# Patient Record
Sex: Female | Born: 1995 | Race: White | Hispanic: No | Marital: Single | Smoking: Never smoker
Health system: Southern US, Community
[De-identification: ages and names within clinical notes are randomized; demographics above are authoritative.]

## PROBLEM LIST (undated history)

## (undated) DIAGNOSIS — S060X9A Concussion with loss of consciousness of unspecified duration, initial encounter: Secondary | ICD-10-CM

## (undated) DIAGNOSIS — J45909 Unspecified asthma, uncomplicated: Secondary | ICD-10-CM

## (undated) HISTORY — PX: OTHER SURGICAL HISTORY: SHX169

## (undated) HISTORY — PX: FINGER SURGERY: SHX640

---
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
## 2017-06-09 ENCOUNTER — Encounter (HOSPITAL_COMMUNITY): Payer: Self-pay | Admitting: Emergency Medicine

## 2017-06-09 ENCOUNTER — Emergency Department (HOSPITAL_COMMUNITY)
Admission: EM | Admit: 2017-06-09 | Discharge: 2017-06-10 | Disposition: A | Discharge status: Home / Self Care | Payer: Medicaid Other | Attending: Emergency Medicine | Admitting: Emergency Medicine

## 2017-06-09 ENCOUNTER — Emergency Department (HOSPITAL_COMMUNITY): Payer: Medicaid Other

## 2017-06-09 DIAGNOSIS — J45909 Unspecified asthma, uncomplicated: Secondary | ICD-10-CM | POA: Diagnosis not present

## 2017-06-09 DIAGNOSIS — Z885 Allergy status to narcotic agent status: Secondary | ICD-10-CM | POA: Diagnosis not present

## 2017-06-09 DIAGNOSIS — M25572 Pain in left ankle and joints of left foot: Secondary | ICD-10-CM | POA: Insufficient documentation

## 2017-06-09 DIAGNOSIS — Z79899 Other long term (current) drug therapy: Secondary | ICD-10-CM | POA: Insufficient documentation

## 2017-06-09 DIAGNOSIS — Z23 Encounter for immunization: Secondary | ICD-10-CM | POA: Diagnosis not present

## 2017-06-09 MED ORDER — TETANUS-DIPHTH-ACELL PERTUSSIS 5-2.5-18.5 LF-MCG/0.5 IM SUSP
0.5000 mL | Freq: Once | INTRAMUSCULAR | Status: AC
  Administered 2017-06-10: 0.5 mL via INTRAMUSCULAR
  Filled 2017-06-09: qty 0.5

## 2017-06-09 NOTE — ED Provider Notes (Signed)
[facility]EMER[facility]ENCY DEPT Provider Note   CSN: [id number] Arrival date & time: 06/09/17  2045  By signing my name below, I, [name], attest that this documentation has been prepared under the direction and in the presence of SPX CorporationMichael Maczis, PA-C.  Electronically Signed: [name], ED Scribe. 06/09/17. 11:44 PM.  History   Chief Complaint Chief Complaint  Patient presents with  . Ankle Pain   The history is provided by the patient. No language interpreter was used.   HPI Comments: Cathy Tran is a 21 y.o. female who presents to the Emergency Department complaining of sudden onset, persistent left ankle pain beginning prior to arrival approximately 3 hours ago. Pt reports that she was standing near a wall that was holding a large fire extinguisher when she back into it and it fell of onto her left ankle. She has been able to bear weight on the ankle since the incident, however, she notes this will exacerbate her pain. No treatments for her pain were tried prior to coming into the ED. She denies numbness, weakness, paraesthesias, or any other associated symptoms. Tetanus is not UTD.   Past Medical History:  Diagnosis Date  . Asthma   . Concussion July 2014   Patient Active Problem List   Diagnosis Date Noted  . Nexplanon in place 11/12/2015  . Dysmenorrhea in adolescent 09/16/2015  . Anxiety state, unspecified 11/20/2013  . Postconcussion syndrome 09/20/2013  . Memory difficulties 09/20/2013  . Poor concentration 09/20/2013   Past Surgical History:  Procedure Laterality Date  . arm surgery    . FINGER SURGERY Left    Cyst removed from left ring finger   OB History    Gravida Para Term Preterm AB Living   0 0 0 0 0 0   SAB TAB Ectopic Multiple Live Births   0 0 0 0       Home Medications    Prior to Admission medications   Medication Sig Start Date End Date Taking? Authorizing Provider  Acetaminophen-Pamabrom (MIDOL TEEN) 500-25 MG TABS Take 2 tablets  by mouth every 6 (six) hours as needed. Patient not taking: Reported on 11/12/2016 09/16/15   Orvilla Cornwallenney, Rachelle A, CNM  albuterol (PROVENTIL HFA;VENTOLIN HFA) 108 (90 BASE) MCG/ACT inhaler Inhale 2 puffs into the lungs every 6 (six) hours as needed for wheezing or shortness of breath.    [provider]  b complex vitamins tablet Take 1 tablet by mouth daily.    [provider]  doxycycline (MONODOX) 100 MG capsule Take 1 capsule (100 mg total) by mouth 2 (two) times daily. Patient not taking: Reported on 11/12/2016 11/12/15   Orvilla Cornwallenney, Rachelle A, CNM  ibuprofen (ADVIL,MOTRIN) 600 MG tablet Take 1 tablet (600 mg total) by mouth 3 (three) times daily. While on your  period Patient not taking: Reported on 11/12/2016 09/16/15   Roe Coombsenney, Rachelle A, CNM   Family History Family History  Problem Relation Age of Onset  . Adopted: Yes  . Drug abuse Mother   . ADD / ADHD Father   . Drug abuse Father   . Depression Father   . Heart attack Paternal Grandfather    Social History Social History  Substance Use Topics  . Smoking status: Never Smoker  . Smokeless tobacco: Never Used  . Alcohol use No   Allergies   Codeine and Other  Review of Systems Review of Systems  Musculoskeletal: Positive for arthralgias and myalgias.  Skin: Positive for wound.  Neurological: Negative for  weakness and numbness.   Physical Exam Updated Vital Signs BP 121/68 (BP Location: Left Arm)   Pulse (!) 16   Temp 98.3 F (36.8 C) (Oral)   Resp 16   Ht 5\' 1"  (1.549 m)   Wt 52.2 kg (115 lb)   SpO2 100%   BMI 21.73 kg/m   Physical Exam  Constitutional: She appears well-developed and well-nourished. No distress.  HENT:  Head: Normocephalic and atraumatic.  Right Ear: External ear normal.  Left Ear: External ear normal.  Eyes: Conjunctivae are normal. Right eye exhibits no discharge. Left eye exhibits no discharge. No scleral icterus.  Neck: Normal range of motion.  Cardiovascular: Normal rate.     Pulses:      Dorsalis pedis pulses are 2+ on the right side, and 2+ on the left side.       Posterior tibial pulses are 2+ on the right side, and 2+ on the left side.  Pulmonary/Chest: Effort normal. No respiratory distress.  Abdominal: She exhibits no distension.  Musculoskeletal: Normal range of motion.       Left ankle: She exhibits normal range of motion (painful with plantar flexion ), no swelling and no deformity. Tenderness ( over area of abrasion). Achilles tendon normal.       Left foot: Normal.  NVI distally.   Neurological: She is alert. No sensory deficit. Gait ( painful but able) normal.  Skin: Skin is warm and dry. Capillary refill takes less than 2 seconds. No pallor.  1cm superficial abrasion over the achilles tendon. No exposed muscle or tendons.   Psychiatric: She has a normal mood and affect. Her behavior is normal.  Nursing note and vitals reviewed.  ED Treatments / Results  DIAGNOSTIC STUDIES: Oxygen Saturation is 99% on RA, normal by my interpretation.   COORDINATION OF CARE: 11:44 PM-Discussed next steps with pt. Pt verbalized understanding and is agreeable with the plan.   Labs (all labs ordered are listed, but only abnormal results are displayed) Labs Reviewed - No data to display  EKG  EKG Interpretation None      Radiology Dg Ankle Complete Left  Result Date: 06/09/2017 CLINICAL DATA:  Injury to the left ankle from a fall. Pain over the heel with a small cut. Pain with flexion and extension. EXAM: LEFT ANKLE COMPLETE - 3+ VIEW COMPARISON:  Left foot 11/05/2011 FINDINGS: There is no evidence of fracture, dislocation, or joint effusion. There is no evidence of arthropathy or other focal bone abnormality. Soft tissues are unremarkable. IMPRESSION: Negative. Electronically Signed   By: Burman Nieves M.D.   On: 06/09/2017 21:24   Procedures Procedures   Medications Ordered in ED Medications  Tdap (BOOSTRIX) injection 0.5 mL (0.5 mLs Intramuscular  Given 06/10/17 0017)    Initial Impression / Assessment and Plan / ED Course  I have reviewed the triage vital signs and the nursing notes.  Pertinent labs & imaging results that were available during my care of the patient were reviewed by me and considered in my medical decision making (see chart for details).     This is an 21yo female who presents into the ED following a large fire extinguisher falling onto the left ankle. Patient XR negative for obvious fracture, dislocation, or other bony abnormalities. Pain medication offered in ED but patient denies at this time. Pt advised to follow up with orthopedics if symptoms persist for possibility of missed fracture diagnosis. Conservative therapy recommended and discussed. Patient will be d/c home. Pt is comfortable  with above plan and is stable for discharge at this time. All questions were answered prior to disposition. Strict return precautions for f/u into the ED were discussed.   Final Clinical Impressions(s) / ED Diagnoses   Final diagnoses:  Acute left ankle pain   New Prescriptions Discharge Medication List as of 06/10/2017 12:00 AM     I personally performed the services described in this documentation, which was scribed in my presence. The recorded information has been reviewed and is accurate.       Jacinto HalimMaczis, Michael M, PA-C 06/10/17 0131    Jacinto HalimMaczis, Michael M, PA-C 06/10/17 0131    Jacalyn LefevreHaviland, Julie, MD 06/13/17 [id number]

## 2017-06-09 NOTE — ED Triage Notes (Signed)
Pt c/o left ankle pain after a fire extinguisher fell on her. Pedal pulses present.

## 2017-06-10 NOTE — Discharge Instructions (Signed)
Your xrays were negative for fracture today. You were given a tetanus shot. Please follow rice therapy for the following days. Please follow up with your PCP for persistent symptoms. If you develop worsening or new concerning symptoms you can return to the emergency department for re-evaluation.

## 2017-07-22 ENCOUNTER — Emergency Department (HOSPITAL_COMMUNITY)
Admission: EM | Admit: 2017-07-22 | Discharge: 2017-07-22 | Disposition: A | Discharge status: Home / Self Care | Payer: Medicaid Other | Attending: Emergency Medicine | Admitting: Emergency Medicine

## 2017-07-22 ENCOUNTER — Encounter (HOSPITAL_COMMUNITY): Payer: Self-pay

## 2017-07-22 DIAGNOSIS — M542 Cervicalgia: Secondary | ICD-10-CM | POA: Insufficient documentation

## 2017-07-22 DIAGNOSIS — R51 Headache: Secondary | ICD-10-CM | POA: Diagnosis not present

## 2017-07-22 DIAGNOSIS — Z5321 Procedure and treatment not carried out due to patient leaving prior to being seen by health care provider: Secondary | ICD-10-CM | POA: Diagnosis not present

## 2017-07-22 NOTE — ED Triage Notes (Signed)
Pt presents to the ed with complaints of left sided neck pain that goes down into her left arm that has been going on for 2 years. Over the last six weeks she has started developing migraines that are throbbing in nature daily. She went to her doctor who states that she seems off balance to her so she told her to come here for a head ct. Pt has no neuro deficits in triage. Alert oriented and ambulatory. Complains of only mild neck and shoulder pain at this time.

## 2017-07-23 ENCOUNTER — Encounter (HOSPITAL_COMMUNITY): Payer: Self-pay | Admitting: Emergency Medicine

## 2017-07-23 ENCOUNTER — Emergency Department (HOSPITAL_COMMUNITY): Payer: Medicaid Other

## 2017-07-23 ENCOUNTER — Emergency Department (HOSPITAL_COMMUNITY)
Admission: EM | Admit: 2017-07-23 | Discharge: 2017-07-23 | Disposition: A | Discharge status: Home / Self Care | Payer: Medicaid Other | Attending: Physician Assistant | Admitting: Physician Assistant

## 2017-07-23 DIAGNOSIS — M542 Cervicalgia: Secondary | ICD-10-CM | POA: Insufficient documentation

## 2017-07-23 DIAGNOSIS — R51 Headache: Secondary | ICD-10-CM | POA: Diagnosis not present

## 2017-07-23 DIAGNOSIS — J45909 Unspecified asthma, uncomplicated: Secondary | ICD-10-CM | POA: Insufficient documentation

## 2017-07-23 LAB — URINALYSIS, ROUTINE W REFLEX MICROSCOPIC
Bacteria, UA: NONE SEEN
Bilirubin Urine: NEGATIVE
Glucose, UA: NEGATIVE mg/dL
KETONES UR: NEGATIVE mg/dL
LEUKOCYTES UA: NEGATIVE
Nitrite: NEGATIVE
PH: 7 (ref 5.0–8.0)
Protein, ur: NEGATIVE mg/dL
RBC / HPF: NONE SEEN RBC/hpf (ref 0–5)
Specific Gravity, Urine: 1.008 (ref 1.005–1.030)
Squamous Epithelial / LPF: NONE SEEN

## 2017-07-23 LAB — CBC WITH DIFFERENTIAL/PLATELET
BASOS ABS: 0 10*3/uL (ref 0.0–0.1)
Basophils Relative: 0 %
Eosinophils Absolute: 0.3 10*3/uL (ref 0.0–0.7)
Eosinophils Relative: 5 %
HCT: 37.5 % (ref 36.0–46.0)
Hemoglobin: 13 g/dL (ref 12.0–15.0)
LYMPHS PCT: 45 %
Lymphs Abs: 3.2 10*3/uL (ref 0.7–4.0)
MCH: 30.9 pg (ref 26.0–34.0)
MCHC: 34.7 g/dL (ref 30.0–36.0)
MCV: 89.1 fL (ref 78.0–100.0)
MONO ABS: 0.5 10*3/uL (ref 0.1–1.0)
Monocytes Relative: 7 %
Neutro Abs: 3 10*3/uL (ref 1.7–7.7)
Neutrophils Relative %: 43 %
Platelets: 292 10*3/uL (ref 150–400)
RBC: 4.21 MIL/uL (ref 3.87–5.11)
RDW: 12.2 % (ref 11.5–15.5)
WBC: 7 10*3/uL (ref 4.0–10.5)

## 2017-07-23 LAB — COMPREHENSIVE METABOLIC PANEL
ALBUMIN: 4.6 g/dL (ref 3.5–5.0)
ALT: 14 U/L (ref 14–54)
AST: 20 U/L (ref 15–41)
Alkaline Phosphatase: 81 U/L (ref 38–126)
Anion gap: 10 (ref 5–15)
BILIRUBIN TOTAL: 0.5 mg/dL (ref 0.3–1.2)
BUN: 14 mg/dL (ref 6–20)
CO2: 23 mmol/L (ref 22–32)
Calcium: 9 mg/dL (ref 8.9–10.3)
Chloride: 105 mmol/L (ref 101–111)
Creatinine, Ser: 0.68 mg/dL (ref 0.44–1.00)
GFR calc Af Amer: >60 mL/min (ref >60)
GFR calc non Af Amer: >60 mL/min (ref >60)
GLUCOSE: 101 mg/dL — AB (ref 65–99)
Potassium: 3.5 mmol/L (ref 3.5–5.1)
Sodium: 138 mmol/L (ref 135–145)
TOTAL PROTEIN: 7.3 g/dL (ref 6.5–8.1)

## 2017-07-23 LAB — I-STAT BETA HCG BLOOD, ED (MC, WL, AP ONLY): I-stat hCG, quantitative: <5 m[IU]/mL (ref <5)

## 2017-07-23 MED ORDER — METHOCARBAMOL 500 MG PO TABS: 500.0000 mg | ORAL_TABLET | Freq: Two times a day (BID) | ORAL | 0 refills | Status: AC

## 2017-07-23 MED ORDER — SODIUM CHLORIDE 0.9 % IV BOLUS (SEPSIS)
1000.0000 mL | Freq: Once | INTRAVENOUS | Status: AC
  Administered 2017-07-23: 1000 mL via INTRAVENOUS

## 2017-07-23 MED ORDER — LIDOCAINE 5 % EX PTCH: 1.0000 | MEDICATED_PATCH | CUTANEOUS | 0 refills | Status: AC

## 2017-07-23 NOTE — ED Provider Notes (Signed)
[facility] Provider Note   CSN: [id number] Arrival date & time: 07/23/17  4098     History   Chief Complaint Chief Complaint  Patient presents with  . Neck Pain    HPI Cathy Tran is a 21 y.o. female.  HPI   Cathy Tran is a 21 y.o. female, with a history of asthma and concussion, presenting to the ED with left neck pain over the last 2 years, worse and more frequent over the last 2 weeks. She now endorses 2 episodes per week. Pain starts in the left side of the neck and radiates into the left shoulder and down the left arm. She states that sometimes the pain radiates up into her head and causes a "migraine." Currently she has no headache. Her neck pain is rated 1/10, described as a sharp, radiating into the left shoulder. Declines pain management during initial interview.  Denies falls/trauma, weakness, numbness, vision changes, difficulty breathing, difficulty swallowing, facial droop, dizziness, fever/chills, or any other complaints.   Patient is accompanied by her mother at the bedside. States patient was seen yesterday at [facility] pediatrics for evaluation of her pain. Reportedly, she had neuro deficits that included tongue deviation to the left and loss of balance. Patient was sent to the ED POV and told that she needed emergent imaging. Mother states that they waited for a few hours and then left.    Past Medical History:  Diagnosis Date  . Asthma   . Concussion July 2014    Patient Active Problem List   Diagnosis Date Noted  . Nexplanon in place 11/12/2015  . Dysmenorrhea in adolescent 09/16/2015  . Anxiety state, unspecified 11/20/2013  . Postconcussion syndrome 09/20/2013  . Memory difficulties 09/20/2013  . Poor concentration 09/20/2013    Past Surgical History:  Procedure Laterality Date  . arm surgery    . FINGER SURGERY Left    Cyst removed from left ring finger    OB History    Gravida Para Term Preterm AB Living   0 0 0 0 0 0   SAB  TAB Ectopic Multiple Live Births   0 0 0 0         Home Medications    Prior to Admission medications   Medication Sig Start Date End Date Taking? Authorizing Provider  Multiple Vitamins-Minerals (MULTIVITAMIN PO) Take 1 tablet by mouth daily.   Yes [provider]  Acetaminophen-Pamabrom (MIDOL TEEN) 500-25 MG TABS Take 2 tablets by mouth every 6 (six) hours as needed. Patient not taking: Reported on 11/12/2016 09/16/15   Orvilla Cornwall A, CNM  albuterol (PROVENTIL HFA;VENTOLIN HFA) 108 (90 BASE) MCG/ACT inhaler Inhale 2 puffs into the lungs every 6 (six) hours as needed for wheezing or shortness of breath.    [provider]  ibuprofen (ADVIL,MOTRIN) 600 MG tablet Take 1 tablet (600 mg total) by mouth 3 (three) times daily. While on your  period Patient not taking: Reported on 11/12/2016 09/16/15   Orvilla Cornwall A, CNM  lidocaine (LIDODERM) 5 % Place 1 patch onto the skin daily. Remove & Discard patch within 12 hours or as directed by MD 07/23/17   Russell Engelstad C, PA-C  methocarbamol (ROBAXIN) 500 MG tablet Take 1 tablet (500 mg total) by mouth 2 (two) times daily. 07/23/17   Grabiel Schmutz, Hillard Danker, PA-C    Family History Family History  Problem Relation Age of Onset  . Adopted: Yes  . Drug abuse Mother   . ADD / ADHD Father   .  Drug abuse Father   . Depression Father   . Heart attack Paternal Grandfather     Social History Social History  Substance Use Topics  . Smoking status: Never Smoker  . Smokeless tobacco: Never Used  . Alcohol use No     Allergies   Codeine and Other   Review of Systems Review of Systems  Constitutional: Negative for chills, diaphoresis and fever.  Respiratory: Negative for shortness of breath.   Cardiovascular: Negative for chest pain.  Gastrointestinal: Negative for abdominal pain, nausea and vomiting.  Genitourinary: Negative for difficulty urinating and dysuria.  Musculoskeletal: Positive for neck pain. Negative for back pain.    Skin: Negative for rash and wound.  Neurological: Positive for headaches. Negative for dizziness, seizures, syncope, weakness, light-headedness and numbness.  All other systems reviewed and are negative.    Physical Exam Updated Vital Signs BP 115/82 (BP Location: Left Arm)   Pulse 92   Temp 98.6 F (37 C) (Oral)   Resp 16   SpO2 100%   Physical Exam  Constitutional: She is oriented to person, place, and time. She appears well-developed and well-nourished. No distress.  HENT:  Head: Normocephalic and atraumatic.  Eyes: Pupils are equal, round, and reactive to light. Conjunctivae and EOM are normal.  Neck: Normal range of motion. Neck supple.  Cardiovascular: Normal rate, regular rhythm, normal heart sounds and intact distal pulses.   Pulmonary/Chest: Effort normal and breath sounds normal. No respiratory distress.  Abdominal: There is no guarding.  Musculoskeletal: She exhibits no edema.  No tenderness to the neck or shoulders bilaterally. Full range of motion without pain or other symptoms elicited in the bilateral shoulders, elbows, wrists, and hands. No noted swelling, crepitus, erythema, or effusions. Normal motor function intact in all extremities and spine. No midline spinal tenderness.   Lymphadenopathy:    She has no cervical adenopathy.  Neurological: She is alert and oriented to person, place, and time.  No sensory deficits.  No noted speech deficits. No aphasia. Patient handles oral secretions without difficulty. No noted swallowing defects.  Equal grip strength bilaterally. Strength 5/5 in the upper extremities. Strength 5/5 with flexion and extension of the hips, knees, and ankles bilaterally.  Patellar DTRs 2+ bilaterally. Negative Romberg. No gait disturbance.  Coordination intact including heel to shin and finger to nose.  Cranial nerves III-XII grossly intact.  No facial droop.   Skin: Skin is warm and dry. Capillary refill takes less than 2 seconds. She is  not diaphoretic.  Psychiatric: She has a normal mood and affect. Her behavior is normal.  Nursing note and vitals reviewed.    ED Treatments / Results  Labs (all labs ordered are listed, but only abnormal results are displayed) Labs Reviewed  COMPREHENSIVE METABOLIC PANEL - Abnormal; Notable for the following:       Result Value   Glucose, Bld 101 (*)    All other components within normal limits  URINALYSIS, ROUTINE W REFLEX MICROSCOPIC - Abnormal; Notable for the following:    Color, Urine STRAW (*)    Hgb urine dipstick SMALL (*)    All other components within normal limits  CBC WITH DIFFERENTIAL/PLATELET  I-STAT BETA HCG BLOOD, ED ([facility], [facility], [facility] ONLY)    EKG  EKG Interpretation None       Radiology Ct Head Wo Contrast  Result Date: 07/23/2017 CLINICAL DATA:  Left neck pain radiating into left arm for 6 weeks. EXAM: CT HEAD WITHOUT CONTRAST TECHNIQUE: Contiguous axial images were  obtained from the base of the skull through the vertex without intravenous contrast. COMPARISON:  October 31, 2013 FINDINGS: Brain: No evidence of acute infarction, hemorrhage, hydrocephalus, extra-axial collection or mass lesion/mass effect. Vascular: No hyperdense vessel or unexpected calcification. Skull: Normal. Negative for fracture or focal lesion. Sinuses/Orbits: The paranasal sinuses, middle ears, and right mastoid air cells are within normal limits. Opacification of inferior medial left mastoid air cells is unchanged since 2014, of no acute significance. Other: None. IMPRESSION: No acute abnormality.  Essentially normal study. Electronically Signed   By: Gerome Sam III M.D   On: 07/23/2017 13:45   Mr Cervical Spine Wo Contrast  Result Date: 07/23/2017 CLINICAL DATA:  Intermittent shooting pain in the left neck that radiates into the left arm. EXAM: MRI CERVICAL AND THORACIC SPINE WITHOUT CONTRAST TECHNIQUE: Multiplanar and multiecho pulse sequences of the cervical spine, to include the  craniocervical junction and cervicothoracic junction, and thoracic spine, were obtained without intravenous contrast. COMPARISON:  None. FINDINGS: MRI CERVICAL SPINE FINDINGS Alignment: Reversal of the normal cervical lordosis, centered at C5. Sagittal alignment is maintained. Vertebrae: No fracture, evidence of discitis, or bone lesion. Cord: Normal signal and morphology. Posterior Fossa, vertebral arteries, paraspinal tissues: Negative. Disc levels: C2-C3: Mild left uncovertebral hypertrophy resulting in mild left neuroforaminal stenosis. No significant central spinal canal or right neuroforaminal stenosis. C3-C4: Minimal bilateral uncovertebral hypertrophy resulting in minimal bilateral neuroforaminal stenosis. No significant central spinal canal stenosis. C4-C5:  Negative. C5-C6: Trace disc bulge without spinal canal or neuroforaminal stenosis. C6-C7:  Negative. C7-T1:  Negative. MRI THORACIC SPINE FINDINGS Alignment:  Physiologic. Vertebrae: No fracture, evidence of discitis, or bone lesion. Cord:  Normal signal and morphology. Paraspinal and other soft tissues: Negative. Disc levels: Normal.  No spinal canal or neuroforaminal stenosis at any level. IMPRESSION: 1. Minimal degenerative changes of the cervical spine, as described above. Mild left neuroforaminal stenosis at C2-C3. 2. Normal MRI of the thoracic spine. Electronically Signed   By: Obie Dredge M.D.   On: 07/23/2017 11:45   Mr Thoracic Spine Wo Contrast  Result Date: 07/23/2017 CLINICAL DATA:  Intermittent shooting pain in the left neck that radiates into the left arm. EXAM: MRI CERVICAL AND THORACIC SPINE WITHOUT CONTRAST TECHNIQUE: Multiplanar and multiecho pulse sequences of the cervical spine, to include the craniocervical junction and cervicothoracic junction, and thoracic spine, were obtained without intravenous contrast. COMPARISON:  None. FINDINGS: MRI CERVICAL SPINE FINDINGS Alignment: Reversal of the normal cervical lordosis, centered  at C5. Sagittal alignment is maintained. Vertebrae: No fracture, evidence of discitis, or bone lesion. Cord: Normal signal and morphology. Posterior Fossa, vertebral arteries, paraspinal tissues: Negative. Disc levels: C2-C3: Mild left uncovertebral hypertrophy resulting in mild left neuroforaminal stenosis. No significant central spinal canal or right neuroforaminal stenosis. C3-C4: Minimal bilateral uncovertebral hypertrophy resulting in minimal bilateral neuroforaminal stenosis. No significant central spinal canal stenosis. C4-C5:  Negative. C5-C6: Trace disc bulge without spinal canal or neuroforaminal stenosis. C6-C7:  Negative. C7-T1:  Negative. MRI THORACIC SPINE FINDINGS Alignment:  Physiologic. Vertebrae: No fracture, evidence of discitis, or bone lesion. Cord:  Normal signal and morphology. Paraspinal and other soft tissues: Negative. Disc levels: Normal.  No spinal canal or neuroforaminal stenosis at any level. IMPRESSION: 1. Minimal degenerative changes of the cervical spine, as described above. Mild left neuroforaminal stenosis at C2-C3. 2. Normal MRI of the thoracic spine. Electronically Signed   By: Obie Dredge M.D.   On: 07/23/2017 11:45    Procedures Procedures (including critical care time)  Medications Ordered in ED Medications  sodium chloride 0.9 % bolus 1,000 mL (0 mLs Intravenous Stopped 07/23/17 1426)     Initial Impression / Assessment and Plan / ED Course  I have reviewed the triage vital signs and the nursing notes.  Pertinent labs & imaging results that were available during my care of the patient were reviewed by me and considered in my medical decision making (see chart for details).  Clinical Course as of Jul 24 1651  Sat Jul 23, 2017  1610 Spoke with [facility] answering service.  [SJ]  [id number] Spoke with Dr. Eartha Inch, on call pediatrician for [facility] pediatrics. Patient was seen yesterday by Sheran Spine, FNP and overseen by Dr. Eartha Inch, who did not  personally assess the patient. States the NP noted. Tongue deviation to the left and loss of balance with left-sided lean. States they wanted the patient assessed yesterday with possible stroke on the differential. She denies any other focal deficits noted on exam. Dr. Eartha Inch was informed that the patient has no such deficits today. She states that she is more than willing to follow up with the patient on any radiculopathy work up.  [SJ]  1228 Discussed MRI and lab results with the patient. Patient is pain-free.  [SJ]    Clinical Course User Index [SJ] Shalyn Koral C, PA-C    Patient presents with complaints of neck pain and requests for MRI. Patient has no neuro deficits on her exam today. Upon repeat assessment she had no changes in her neuro exam. There were no acute abnormalities on patient's imaging studies or lab tests. My suspicion for stroke or TIA in this patient is very low. PCP versus orthopedic versus neurosurgery follow-up for neck pain. PCP versus neurology follow-up for headaches. The patient was given instructions for home care as well as return precautions. Patient voices understanding of these instructions, accepts the plan, and is comfortable with discharge.  Findings and plan of care discussed with Bary Castilla, MD. Dr. Corlis Leak personally evaluated and examined this patient.  Vitals:   07/23/17 0915 07/23/17 1145 07/23/17 1200 07/23/17 1432  BP: 116/64 107/63 108/66 110/69  Pulse: 79 84 76 79  Resp:    14  Temp:    98.7 F (37.1 C)  TempSrc:    Oral  SpO2: 100% 99% 100% 100%     Final Clinical Impressions(s) / ED Diagnoses   Final diagnoses:  Neck pain    New Prescriptions Discharge Medication List as of 07/23/2017  1:53 PM    START taking these medications   Details  lidocaine (LIDODERM) 5 % Place 1 patch onto the skin daily. Remove & Discard patch within 12 hours or as directed by MD, Starting Sat 07/23/2017, Print    methocarbamol (ROBAXIN) 500 MG tablet  Take 1 tablet (500 mg total) by mouth 2 (two) times daily., Starting Sat 07/23/2017, Print         Anselm Pancoast, PA-C 07/23/17 1652    Abelino Derrick, MD 07/24/17 1406

## 2017-07-23 NOTE — Discharge Instructions (Signed)
There were no acute abnormalities noted on the lab work.   Antiinflammatory medications: Take 600 mg of ibuprofen every 6 hours or 440 mg (over the counter dose) to 500 mg (prescription dose) of naproxen every 12 hours for the next 3 days. After this time, these medications may be used as needed for pain. Take these medications with food to avoid upset stomach. Choose only one of these medications, do not take them together.  Tylenol: Should you continue to have additional pain while taking the ibuprofen or naproxen, you may add in tylenol as needed. Your daily total maximum amount of tylenol from all sources should be limited to /day for persons without liver problems, or /day for those with liver problems. Muscle relaxer: Robaxin is a muscle relaxer and may help loosen stiff muscles. Do not take the Robaxin while driving or performing other dangerous activities.  Lidocaine patches: These are available via either prescription or over-the-counter. The over-the-counter option may be more economical one and are likely just as effective. There are multiple over-the-counter brands, such as Salonpas. Exercises: Be sure to perform the attached exercises starting with three times a week and working up to performing them daily. This is an essential part of preventing long term problems. Note that the sheet regarding cervical strain/sprain does not denote your diagnosis, it has been included simply for the exercises.   Follow up with a primary care provider for any future management of these complaints. May also follow-up with orthopedics or neurosurgery. Follow-up with your primary care provider or with neurology for further management of the headaches.

## 2017-07-23 NOTE — ED Triage Notes (Signed)
Patient complains of intermittent shooting pain in her left neck that radiates into the left arm for the last few years, exacerbated by movement of the arm, pain is minimal at rest.  Denies chest pain, shortness of breath, nausea.  Also reports the pain is often accompanied by migraines.  Patient alert and oriented and in no apparent distress at this time.

## 2017-08-01 ENCOUNTER — Encounter (INDEPENDENT_AMBULATORY_CARE_PROVIDER_SITE_OTHER): Payer: Self-pay | Admitting: Orthopaedic Surgery

## 2017-08-01 ENCOUNTER — Ambulatory Visit (INDEPENDENT_AMBULATORY_CARE_PROVIDER_SITE_OTHER): Payer: Medicaid Other | Admitting: Orthopaedic Surgery

## 2017-08-01 DIAGNOSIS — M25512 Pain in left shoulder: Secondary | ICD-10-CM | POA: Diagnosis not present

## 2017-08-01 DIAGNOSIS — G8929 Other chronic pain: Secondary | ICD-10-CM | POA: Diagnosis not present

## 2017-08-01 NOTE — Addendum Note (Signed)
Addended by: Albertina Parr on: 08/01/2017 03:56 PM   Modules accepted: Orders

## 2017-08-01 NOTE — Progress Notes (Signed)
Office Visit Note   Patient: Cathy Tran           Date of Birth: 1996/05/17           MRN: [id number] Visit Date: 08/01/2017              Requested by: Chales Salmon, MD [address] [place], [place] [address] PCP: Chales Salmon, MD   Assessment & Plan: Visit Diagnoses:  1. Chronic left shoulder pain     Plan: MRI shows some mild foraminal stenosis at C2-3. Overall not convinced that this is the source of her problems. I recommend referral to Dr. Alvester Morin to see if an injection is worth trying. If this does not alleviate her symptoms then I would recommend seeing her PCP again. I do not detect any evidence of carpal tunnel syndrome on exam today either.  Follow-Up Instructions: Return if symptoms worsen or fail to improve.   Orders:  No orders of the defined types were placed in this encounter.  No orders of the defined types were placed in this encounter.     Procedures: No procedures performed   Clinical Data: No additional findings.   Subjective: Chief Complaint  Patient presents with  . Neck - Pain    Patient is a 21 year old female comes in with radiation of pain from her neck into her left hand. This is occasional and sporadic. She denies any injuries. Denies any numbness and tingling in her left hand which consistently. She does not report dropping things.    Review of Systems  Constitutional: Negative.   HENT: Negative.   Eyes: Negative.   Respiratory: Negative.   Cardiovascular: Negative.   Endocrine: Negative.   Musculoskeletal: Negative.   Neurological: Negative.   Hematological: Negative.   Psychiatric/Behavioral: Negative.   All other systems reviewed and are negative.    Objective: Vital Signs: There were no vitals taken for this visit.  Physical Exam  Constitutional: She is oriented to person, place, and time. She appears well-developed and well-nourished.  HENT:  Head: Normocephalic and atraumatic.  Eyes: EOM are normal.  Neck:  Neck supple.  Pulmonary/Chest: Effort normal.  Abdominal: Soft.  Neurological: She is alert and oriented to person, place, and time.  Skin: Skin is warm. Capillary refill takes less than 2 seconds.  Psychiatric: She has a normal mood and affect. Her behavior is normal. Judgment and thought content normal.  Nursing note and vitals reviewed.   Ortho Exam Physical exam shows a equivocal Spurling's test. Shoulder exam is normal. No focal motor or sensory deficits. Specialty Comments:  No specialty comments available.  Imaging: No results found.   PMFS History: Patient Active Problem List   Diagnosis Date Noted  . Chronic left shoulder pain 08/01/2017  . Nexplanon in place 11/12/2015  . Dysmenorrhea in adolescent 09/16/2015  . Anxiety state, unspecified 11/20/2013  . Postconcussion syndrome 09/20/2013  . Memory difficulties 09/20/2013  . Poor concentration 09/20/2013   Past Medical History:  Diagnosis Date  . Asthma   . Concussion July 2014    Family History  Problem Relation Age of Onset  . Adopted: Yes  . Drug abuse Mother   . ADD / ADHD Father   . Drug abuse Father   . Depression Father   . Heart attack Paternal Grandfather     Past Surgical History:  Procedure Laterality Date  . arm surgery    . FINGER SURGERY Left    Cyst removed from left ring finger  Social History   Occupational History  . Not on file.   Social History Main Topics  . Smoking status: Never Smoker  . Smokeless tobacco: Never Used  . Alcohol use No  . Drug use: No  . Sexual activity: Yes    Birth control/ protection: Implant, Condom

## 2017-08-23 ENCOUNTER — Ambulatory Visit (INDEPENDENT_AMBULATORY_CARE_PROVIDER_SITE_OTHER): Payer: Medicaid Other | Admitting: Physical Medicine and Rehabilitation

## 2017-08-23 ENCOUNTER — Encounter (INDEPENDENT_AMBULATORY_CARE_PROVIDER_SITE_OTHER): Payer: Self-pay | Admitting: Physical Medicine and Rehabilitation

## 2017-08-23 VITALS — BP 115/63 | HR 80 | Temp 99.0°F | Resp 20

## 2017-08-23 DIAGNOSIS — M542 Cervicalgia: Secondary | ICD-10-CM

## 2017-08-23 DIAGNOSIS — M7918 Myalgia, other site: Secondary | ICD-10-CM

## 2017-08-23 MED ORDER — TIZANIDINE HCL 4 MG PO CAPS: 4.0000 mg | ORAL_CAPSULE | Freq: Every day | ORAL | 0 refills | Status: AC

## 2017-08-23 NOTE — Progress Notes (Deleted)
Pt here for consult of C-spine, no currently in pain, has episodes of left side neck pain down to shoulder and arm, gets migraines from it also. If its bad gets tingling in finger tips. Has been going in for 2 yrs but has happened more in the last 8-9 weeks. Pt was told by [facility] PA  to take Tylenol and muscle relaxer that was prescribed Robaxin 500 mg.  Referred by PCP Cathy Tran [facility] and had MRI and CT scan on 07/23/17.

## 2017-08-26 ENCOUNTER — Ambulatory Visit: Payer: Medicaid Other | Attending: Physical Medicine and Rehabilitation | Admitting: Rehabilitation

## 2017-08-26 ENCOUNTER — Encounter: Payer: Self-pay | Admitting: Rehabilitation

## 2017-08-26 DIAGNOSIS — R293 Abnormal posture: Secondary | ICD-10-CM | POA: Diagnosis present

## 2017-08-26 DIAGNOSIS — M6281 Muscle weakness (generalized): Secondary | ICD-10-CM | POA: Insufficient documentation

## 2017-08-26 DIAGNOSIS — M25512 Pain in left shoulder: Secondary | ICD-10-CM | POA: Diagnosis present

## 2017-08-26 DIAGNOSIS — G8929 Other chronic pain: Secondary | ICD-10-CM | POA: Diagnosis present

## 2017-08-26 DIAGNOSIS — M542 Cervicalgia: Secondary | ICD-10-CM | POA: Insufficient documentation

## 2017-08-26 NOTE — Therapy (Signed)
[facility] [address] [place], [place], [address] Phone: [phone]   Fax:  [phone]  Physical Therapy Evaluation  Patient Details  Name: Cathy Tran MRN: [id number] Date of Birth: December 29, 1995 Referring Provider: Tyrell Antonio, MD  Encounter Date: 08/26/2017      PT End of Session - 08/26/17 1032    Visit Number 1   Authorization Type Check PA for visit and re-eval dates   PT Start Time 0845   PT Stop Time 0930   PT Time Calculation (min) 45 min   Activity Tolerance Patient tolerated treatment well      Past Medical History:  Diagnosis Date  . Asthma   . Concussion July 2014    Past Surgical History:  Procedure Laterality Date  . arm surgery    . FINGER SURGERY Left    Cyst removed from left ring finger    There were no vitals filed for this visit.       Subjective Assessment - 08/26/17 0848    Subjective pt presents with c/o L sided neck and shoulder pain for no known reason.  This pain has been present x 2 years happening every few months and now it is every few days and lasting longer.     Diagnostic tests MRI and CT scan showing mild C2-3 degeneration of the neck   Patient Stated Goals decrease the pain, improve neck and back strength   Currently in Pain? No/denies   Pain Score --  goes up 8/10 intermitently    Pain Location Shoulder   Pain Orientation Left   Pain Descriptors / Indicators Aching;Burning;Radiating   Pain Type Chronic pain   Pain Radiating Towards goes into the fingers when bad median nerve distribution   Pain Onset More than a month ago   Pain Frequency Intermittent   Aggravating Factors  movements, breathing, coughing when it is aggravated, sometimes it happens even when sitting still   Pain Relieving Factors laying on the shoulder/compressing   Effect of Pain on Daily Activities hard to work when it is aggravated             [facility] PT Assessment - 08/26/17  0001      Assessment   Medical Diagnosis Lt sided neck and shoulder pain   Referring Provider Tyrell Antonio, MD   Onset Date/Surgical Date 11/08/14   Hand Dominance Right   Prior Therapy no     Precautions   Precautions None     Restrictions   Weight Bearing Restrictions No     Balance Screen   Has the patient fallen in the past 6 months No     Home Environment   Living Environment Private residence   Additional Comments Lives in [place] only here on Friday     Prior Function   Level of Independence Independent   Leisure [employer] next semester, working as a Naval architect, 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     Observation/Other Assessments   Focus on Therapeutic Outcomes (FOTO)  30% limited     Posture/Postural Control   Posture/Postural Control Postural limitations   Postural Limitations Rounded Shoulders;Forward head     Tone   Assessment Location Other (comment)     Tone Assessment - Other   Other Tone Location Comments increased tone L UT with      ROM / Strength   AROM / PROM / Strength AROM;PROM;Strength     AROM   Overall AROM Comments shoulder:  all motions WNL with some pain anterior shoulder with behind the back   AROM Assessment Site Cervical;Shoulder   Cervical Flexion 50   Cervical Extension 50   Cervical - Right Side Bend 45   Cervical - Left Side Bend 45   Cervical - Right Rotation 60   Cervical - Left Rotation 60  slight pain     PROM   Overall PROM Comments supine cervical and shoulder PROM WNL and without pain     Strength   Overall Strength Comments UQS myotomes all slightly weaker on the L: shoulder flexion, abduction, ER, IR, elbow flexion, extension, wrist  extension, and thumb extension all 4/5 compared to 5/5, prone UT/LT/MT 4-/5   Strength Assessment Site Cervical;Shoulder     Palpation   Palpation comment 3 ttp R UT with apprehension to deep palpation     Special Tests    Special Tests Cervical   Cervical Tests other      other    Findings Negative   Side Right   Comment negative ULTTs all distributions            Objective measurements completed on examination: See above findings.                  PT Education - 08/26/17 1031    Education provided Yes   Education Details Diagnosis, POC, HEP   Person(s) Educated Patient   Methods Explanation;Handout   Comprehension Need further instruction             PT Long Term Goals - 08/26/17 1104      PT LONG TERM GOAL #1   Title Pt will be independent with final HEP for scapular and L UE strengthening   Baseline no HEP   Time 8   Period Weeks   Status New   Target Date 10/21/17     PT LONG TERM GOAL #2   Title Pt will decrease duration of Lt UE flare-ups to 4 hours or less   Baseline flare ups last 24 hours   Time 8   Period Weeks   Status New   Target Date 10/21/17     PT LONG TERM GOAL #3   Title Pt will decrease pain levels during flare-up to 3/10 or less   Baseline 8/10   Time 8   Period Weeks   Status New   Target Date 10/21/17     PT LONG TERM GOAL #4   Title Pt will improve general Lt prone UT/LT/MT strength to 4/5 or greater   Baseline 4-/5   Time 8   Period Weeks   Status New   Target Date 10/21/17                Plan - 08/26/17 1033    Clinical Impression Statement Pt presents with 2 year history of R neck, shoulder, and UE pain.  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  Pt intermittent pain that arrives for no known reason and that lasts about 1 day when aggravated.  Nothing makes it worse when not aggravated but when it is flared up it is aggravated by all motions, coughing, and breathing.  Deficits today without pain include tension and trigger points in the L UT, pain in the L UT with cervical rotation, general UE myotomal weakness of the L extremity, scapular weakness prone, and postural  abnormalities.  Clinical Presentation Evolving   Clinical Presentation due to: getting worse recently   Clinical Decision Making Moderate   Rehab Potential Good   PT Frequency 1x / week   PT Duration 8 weeks   PT Treatment/Interventions Dry needling;Electrical Stimulation;Manual techniques;Taping;Moist Heat;Traction;Therapeutic exercise   PT Next Visit Plan review HEP, needling, STM Lt UT/cspine, begin cervical, shoulder, scapular strengthening, modalities PRN   PT Home Exercise Plan given scapular clock, retractions, and UT stretch 10/19   Consulted and Agree with Plan of Care Patient      Patient will benefit from skilled therapeutic intervention in order to improve the following deficits and impairments:  Postural dysfunction, Pain, Impaired UE functional use, Decreased strength  Visit Diagnosis: Abnormal posture - Plan: PT plan of care cert/re-cert  Chronic left shoulder pain - Plan: PT plan of care cert/re-cert  Cervicalgia - Plan: PT plan of care cert/re-cert  Muscle weakness (generalized) - Plan: PT plan of care cert/re-cert     Problem List Patient Active Problem List   Diagnosis Date Noted  . Chronic left shoulder pain 08/01/2017  . Nexplanon in place 11/12/2015  . Dysmenorrhea in adolescent 09/16/2015  . Anxiety state, unspecified 11/20/2013  . Postconcussion syndrome 09/20/2013  . Memory difficulties 09/20/2013  . Poor concentration 09/20/2013    Idamae Lusher, DPT, CMP 08/26/2017, 11:20 AM  [facility] [address] [place], [place], [address] Phone: [phone]   Fax:  [phone]  Name: Cathy Tran MRN: [id number] Date of Birth: 02-16-1996

## 2017-09-05 ENCOUNTER — Encounter (INDEPENDENT_AMBULATORY_CARE_PROVIDER_SITE_OTHER): Payer: Self-pay | Admitting: Physical Medicine and Rehabilitation

## 2017-09-05 NOTE — Progress Notes (Signed)
Cathy Tran - 21 y.o. female MRN [id number]  Date of birth: [birth year]  Office Visit Note: Visit Date: 08/23/2017 PCP: Cathy Salmon, MD Referred by: Cathy Salmon, MD  Subjective: Chief Complaint  Patient presents with  . Spine - Pain  . Referral   HPI: Cathy Tran is a pleasant 21 year old female originally referred by her pediatrician Dr. Avis Tran and seen by Dr. Roda Tran in our office.  Her history is that she was competitive in gymnastics as well as cheerleading and injuries from that as well as having several years now of neck pain and associated headaches.  The neck pain will be on the left side usually will refer into the shoulder and arm.  She reports that she gets "migraines "from it.  She says this is been going on for 2 years but really more in the last 8-9 weeks.  She feels like her pain right now is somewhat better than when she first saw Dr. Roda Tran.  Dr. Roda Tran evaluated her and felt like it was worthwhile getting a cervical and thoracic MRI which were essentially normal except for mild foraminal narrowing at C2-3.  She has been taking Tylenol and Robaxin which helped a little bit.  She has not had physical therapy or chiropractic care.  She has not had dry needling or evaluation for myofascial pain.  She does get some occasional tingling in the fingertips in a nondermatomal fashion on the left.  She states this only happens with the neck and shoulder pain are really bad.  Dr. Roda Tran also felt like she did not have any underlying carpal tunnel syndrome.  She does carry a diagnosis of anxiety.  She denies any specific trauma that started the 8-9 weeks worsening pain.  Denies any focal weakness.    Review of Systems  Constitutional: Negative for chills, fever, malaise/fatigue and weight loss.  HENT: Negative for hearing loss and sinus pain.   Eyes: Negative for blurred vision, double vision and photophobia.  Respiratory: Negative for cough and shortness of breath.   Cardiovascular: Negative for chest pain,  palpitations and leg swelling.  Gastrointestinal: Negative for abdominal pain, nausea and vomiting.  Genitourinary: Negative for flank pain.  Musculoskeletal: Positive for joint pain and neck pain. Negative for myalgias.  Skin: Negative for itching and rash.  Neurological: Positive for tingling. Negative for tremors, focal weakness and weakness.  Endo/Heme/Allergies: Negative.   Psychiatric/Behavioral: Negative for depression.  All other systems reviewed and are negative.  Otherwise per HPI.  Assessment & Plan: Visit Diagnoses:  1. Cervicalgia   2. Myofascial pain     Plan: Findings:  Chronic history of neck and shoulder pain likely related to history of cheerleading and gymnastics.  She clearly has a myofascial pain syndrome with active trigger points on exam.  The left trapezius and supraspinatus trigger point was really painful even to very light pressure.  She did not have any tender points but positive muscle trigger points.  These trigger points can have a referral pattern more than he would expect even with tingling sensation and even some autonomic dysfunction at times.  Reviewing her cervical MRI shows this very mild cervical narrowing at C2-3 which is foraminal he.  I reviewed this with her it is very minimal.  Again I think her pain is really more myofascial.  We will send her to physical therapy through [facility] for hopefully manual treatment and trigger point dry needling.  She is medicated I am not sure how much they will approve in  terms of physical therapy.  Trigger point is still a problem we could look at doing the injection here in the office as well.  She will follow-up with her pediatrician Dr. Avis Epleyees and Dr. Roda ShuttersXu if needed.  I also took the liberty of changing her muscle relaxer to Zanaflex at night which should help her sleep without being a very bad medication.  I do think trigger points are somewhat related to the sympathetic nervous system being on sort of an active demand  and I think this seems to help to a degree.    Meds & Orders:  Meds ordered this encounter  Medications  . tiZANidine (ZANAFLEX) 4 MG capsule    Sig: Take 1 capsule (4 mg total) by mouth at bedtime.    Dispense:  30 capsule    Refill:  0    Orders Placed This Encounter  Procedures  . Ambulatory referral to Physical Therapy    Follow-up: Return if symptoms worsen or fail to improve.   Procedures: No procedures performed  No notes on file   Clinical History: MRI CERVICAL SPINE FINDINGS  Alignment: Reversal of the normal cervical lordosis, centered at C5. Sagittal alignment is maintained.  Vertebrae: No fracture, evidence of discitis, or bone lesion.  Cord: Normal signal and morphology.  Posterior Fossa, vertebral arteries, paraspinal tissues: Negative.  Disc levels:  C2-C3: Mild left uncovertebral hypertrophy resulting in mild left neuroforaminal stenosis. No significant central spinal canal or right neuroforaminal stenosis.  C3-C4: Minimal bilateral uncovertebral hypertrophy resulting in minimal bilateral neuroforaminal stenosis. No significant central spinal canal stenosis.  C4-C5:  Negative.  C5-C6: Trace disc bulge without spinal canal or neuroforaminal stenosis.  C6-C7:  Negative.  C7-T1:  Negative.  MRI THORACIC SPINE FINDINGS  Alignment:  Physiologic.  Vertebrae: No fracture, evidence of discitis, or bone lesion.  Cord:  Normal signal and morphology.  Paraspinal and other soft tissues: Negative.  Disc levels:  Normal.  No spinal canal or neuroforaminal stenosis at any level.  IMPRESSION: 1. Minimal degenerative changes of the cervical spine, as described above. Mild left neuroforaminal stenosis at C2-C3. 2. Normal MRI of the thoracic spine.   Electronically Signed   By: Obie DredgeWilliam T Tran M.D.   On: 07/23/2017 11:45  She reports that she has never smoked. She has never used smokeless tobacco. No results for input(s):  HGBA1C, LABURIC in the last 8760 hours.  Objective:  VS:  HT:    WT:   BMI:     BP:115/63  HR:80bpm  TEMP:99 F (37.2 C)( )  RESP:  Physical Exam  Constitutional: She is oriented to person, place, and time. She appears well-developed and well-nourished.  Eyes: Pupils are equal, round, and reactive to light. Conjunctivae and EOM are normal.  Cardiovascular: Normal rate and intact distal pulses.   Pulmonary/Chest: Effort normal.  Musculoskeletal:  Cervical spine range of motion is full in all directions.  There are active trigger points of the left supraspinatus and trapezius noted.  Shoulder range of motion is full bilaterally without any sign of impingement.  There is a negative Spurling's test bilaterally.  Patient has 5 / 5 strength in all muscle groups of the upper extremities bilaterally without deficits.  The patient has 2+ muscle stretch reflexes at the biceps and brachioradialis bilaterally.  The patient has a negative Hoffman's test bilaterally.     Neurological: She is alert and oriented to person, place, and time. She exhibits normal muscle tone. Coordination normal.  Skin: Skin is warm and  dry. No rash noted. No erythema.  Psychiatric: She has a normal mood and affect. Her behavior is normal.  Nursing note and vitals reviewed.   Ortho Exam Imaging: No results found.  Past Medical/Family/Surgical/Social History: Medications & Allergies reviewed per EMR Patient Active Problem List   Diagnosis Date Noted  . Chronic left shoulder pain 08/01/2017  . Nexplanon in place 11/12/2015  . Dysmenorrhea in adolescent 09/16/2015  . Anxiety state, unspecified 11/20/2013  . Postconcussion syndrome 09/20/2013  . Memory difficulties 09/20/2013  . Poor concentration 09/20/2013   Past Medical History:  Diagnosis Date  . Asthma   . Concussion July 2014   Family History  Problem Relation Age of Onset  . Adopted: Yes  . Drug abuse Mother   . ADD / ADHD Father   . Drug abuse Father    . Depression Father   . Heart attack Paternal Grandfather    Past Surgical History:  Procedure Laterality Date  . arm surgery    . FINGER SURGERY Left    Cyst removed from left ring finger   Social History   Occupational History  . Not on file.   Social History Main Topics  . Smoking status: Never Smoker  . Smokeless tobacco: Never Used  . Alcohol use No  . Drug use: No  . Sexual activity: Yes    Birth control/ protection: Implant, Condom

## 2017-09-09 ENCOUNTER — Ambulatory Visit: Payer: Medicaid Other | Attending: Physical Medicine and Rehabilitation | Admitting: Physical Therapy

## 2017-09-09 DIAGNOSIS — M6281 Muscle weakness (generalized): Secondary | ICD-10-CM | POA: Insufficient documentation

## 2017-09-09 DIAGNOSIS — M542 Cervicalgia: Secondary | ICD-10-CM | POA: Diagnosis present

## 2017-09-09 DIAGNOSIS — M25512 Pain in left shoulder: Secondary | ICD-10-CM | POA: Diagnosis present

## 2017-09-09 DIAGNOSIS — G8929 Other chronic pain: Secondary | ICD-10-CM | POA: Diagnosis present

## 2017-09-09 DIAGNOSIS — R293 Abnormal posture: Secondary | ICD-10-CM | POA: Insufficient documentation

## 2017-09-09 NOTE — Therapy (Signed)
[facility] [address], [address] [place], [place], [address] Phone: [phone]   Fax:  [phone]  Physical Therapy Treatment  Patient Details  Name: Cathy Tran MRN: [id number] Date of Birth: [birth year] Referring Provider: Tyrell Antonio, MD  Encounter Date: 09/09/2017      PT End of Session - 09/09/17 0914    Visit Number 2   Authorization Type CCME 8 visits 10/26-12/20   Authorization - Number of Visits 8   PT Start Time 0845   PT Stop Time 0930   PT Time Calculation (min) 45 min   Activity Tolerance Patient tolerated treatment well      Past Medical History:  Diagnosis Date  . Asthma   . Concussion July 2014    Past Surgical History:  Procedure Laterality Date  . arm surgery    . FINGER SURGERY Left    Cyst removed from left ring finger    There were no vitals filed for this visit.      Subjective Assessment - 09/09/17 0845    Subjective the pain was only bad 1 day this week but none now.  Sometimes neck and left shoulder.     Currently in Pain? 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, occupational Adult PT Treatment/Exercise - 09/09/17 0001      Neck Exercises: Seated   Other Seated Exercise scapular retractions      Shoulder Exercises: Supine   Other Supine Exercises red band scapular series 10x each per HEP     Moist Heat Therapy   Number Minutes Moist Heat 5 Minutes   Moist Heat Location Shoulder;Cervical     Manual Therapy   Manual Therapy Soft tissue mobilization   Soft tissue mobilization left cervical multifidi, upper traps, rhomboids     Neck Exercises: Stretches   Upper Trapezius Stretch 2 reps;20 seconds          Trigger Point Dry Needling - 09/09/17 0913    Consent Given? Yes   Education Handout Provided Yes   Muscles Treated Upper Body Upper  trapezius;Levator scapulae;Rhomboids   Upper Trapezius Response Twitch reponse elicited;Palpable increased muscle length   Levator Scapulae Response Twitch response elicited;Palpable increased muscle length   Rhomboids Response Palpable increased muscle length      Left only        PT Education - 09/09/17 1107    Education provided Yes   Education Details supine red band scapular series; dry needling info   Person(s) Educated Patient   Methods Explanation;Demonstration;Handout   Comprehension Verbalized understanding;Returned demonstration             PT Long Term Goals - 08/26/17 1104      PT LONG TERM GOAL #1   Title Pt will be independent with final HEP for scapular and L UE strengthening   Baseline no HEP   Time 8   Period Weeks   Status New   Target Date 10/21/17     PT LONG TERM GOAL #2   Title Pt will decrease duration of Lt UE flare-ups to 4 hours or less   Baseline flare ups last 24 hours   Time 8   Period Weeks   Status New   Target Date 10/21/17  PT LONG TERM GOAL #3   Title Pt will decrease pain levels during flare-up to 3/10 or less   Baseline 8/10   Time 8   Period Weeks   Status New   Target Date 10/21/17     PT LONG TERM GOAL #4   Title Pt will improve general Lt prone UT/LT/MT strength to 4/5 or greater   Baseline 4-/5   Time 8   Period Weeks   Status New   Target Date 10/21/17               Plan - 09/09/17 [id number]    Clinical Impression Statement The patient is receptive to trying dry needling to address multiple tender points in left upper traps, cervical multifidi and rhomboids.  Multiple twitch responses produced which is a good prognostic indicator.  Able to perform supine scapular strengthening exercises without pain.     PT Frequency 1x / week   PT Duration 8 weeks   PT Treatment/Interventions Dry needling;Electrical Stimulation;Manual techniques;Taping;Moist Heat;Traction;Therapeutic exercise   PT Next Visit Plan  assess response to DN #1;  review supine band ex's as needed;  add standing band rows, extensions;  prone ex; STM Lt UT/cspine,  modalities PRN      Patient will benefit from skilled therapeutic intervention in order to improve the following deficits and impairments:  Postural dysfunction, Pain, Impaired UE functional use, Decreased strength  Visit Diagnosis: Abnormal posture  Chronic left shoulder pain  Cervicalgia  Muscle weakness (generalized)     Problem List Patient Active Problem List   Diagnosis Date Noted  . Chronic left shoulder pain 08/01/2017  . Nexplanon in place 11/12/2015  . Dysmenorrhea in adolescent 09/16/2015  . Anxiety state, unspecified 11/20/2013  . Postconcussion syndrome 09/20/2013  . Memory difficulties 09/20/2013  . Poor concentration 09/20/2013   Cathy Tran Date, PT 09/09/17 11:11 AM Phone: [phone] Fax: [phone]  Vivien PrestoSimpson, Kesler Wickham C 09/09/2017, 11:11 AM  [facility] [address], [address] [place], [place], [address] Phone: [phone]   Fax:  [phone]  Name: Cathy RuaKela Tran MRN: [id number] Date of Birth: 12/27/1995

## 2017-09-09 NOTE — Patient Instructions (Signed)
  Over Head Pull: Narrow Grip and Wide grip     On back, knees bent, feet flat, band across thighs, elbows straight but relaxed. Pull hands apart (start). Keeping elbows straight, bring arms up and over head, hands toward floor. Keep pull steady on band. Hold momentarily. Return slowly, keeping pull steady, back to start. Repeat __10_ times. Band color __red____   Side Pull: Double Arm and single arm   On back, knees bent, feet flat. Arms perpendicular to body, shoulder level, elbows straight but relaxed. Pull arms out to sides, elbows straight. Resistance band comes across collarbones, hands toward floor. Hold momentarily. Slowly return to starting position. Repeat _10__ times. Band color ___red__   Elisabeth CaraSash   On back, knees bent, feet flat, left hand on left hip, right hand above left. Pull right arm DIAGONALLY (hip to shoulder) across chest. Bring right arm along head toward floor. Hold momentarily. Slowly return to starting position. Repeat __10_ times. Do with left arm. Band color _red_____   Shoulder Rotation: Double Arm and single arm   On back, knees bent, feet flat, elbows tucked at sides, bent 90, hands palms up. Pull hands apart and down toward floor, keeping elbows near sides. Hold momentarily. Slowly return to starting position. Repeat _10__ times. Band color __red____     Trigger Point Dry Needling  . What is Trigger Point Dry Needling (DN)? o DN is a physical therapy technique used to treat muscle pain and dysfunction. Specifically, DN helps deactivate muscle trigger points (muscle knots).  o A thin filiform needle is used to penetrate the skin and stimulate the underlying trigger point. The goal is for a local twitch response (LTR) to occur and for the trigger point to relax. No medication of any kind is injected during the procedure.   . What Does Trigger Point Dry Needling Feel Like?  o The procedure feels different for each individual patient. Some patients report that  they do not actually feel the needle enter the skin and overall the process is not painful. Very mild bleeding may occur. However, many patients feel a deep cramping in the muscle in which the needle was inserted. This is the local twitch response.   Marland Kitchen. How Will I feel after the treatment? o Soreness is normal, and the onset of soreness may not occur for a few hours. Typically this soreness does not last longer than two days.  o Bruising is uncommon, however; ice can be used to decrease any possible bruising.  o In rare cases feeling tired or nauseous after the treatment is normal. In addition, your symptoms may get worse before they get better, this period will typically not last longer than 24 hours.   . What Can I do After My Treatment? o Increase your hydration by drinking more water for the next 24 hours. o You may place ice or heat on the areas treated that have become sore, however, do not use heat on inflamed or bruised areas. Heat often brings more relief post needling. o You can continue your regular activities, but vigorous activity is not recommended initially after the treatment for 24 hours. o DN is best combined with other physical therapy such as strengthening, stretching, and other therapies.    Lavinia SharpsStacy Dragon Thrush PT [facility] [address], [address] [place], [place] [address] Phone # [phone] Fax [phone]

## 2017-09-16 ENCOUNTER — Telehealth: Payer: Self-pay | Admitting: Physical Therapy

## 2017-09-16 ENCOUNTER — Ambulatory Visit: Payer: Medicaid Other | Admitting: Physical Therapy

## 2017-09-16 NOTE — Telephone Encounter (Signed)
Called regarding no-show for appt.  Left message about next appt 11/16 at 8:45

## 2017-09-23 ENCOUNTER — Ambulatory Visit: Payer: Medicaid Other | Admitting: Physical Therapy

## 2017-09-23 DIAGNOSIS — M25512 Pain in left shoulder: Secondary | ICD-10-CM

## 2017-09-23 DIAGNOSIS — R293 Abnormal posture: Secondary | ICD-10-CM | POA: Diagnosis not present

## 2017-09-23 DIAGNOSIS — M542 Cervicalgia: Secondary | ICD-10-CM

## 2017-09-23 DIAGNOSIS — G8929 Other chronic pain: Secondary | ICD-10-CM

## 2017-09-23 DIAGNOSIS — M6281 Muscle weakness (generalized): Secondary | ICD-10-CM

## 2017-09-23 NOTE — Patient Instructions (Signed)
Evelette Hollern PT [facility] [address], [address] [place], [place] [address] Phone # [phone] Fax [phone]    

## 2017-09-23 NOTE — Therapy (Signed)
[facility] [address], [address] [place], [place], [address] Phone: [phone]   Fax:  [phone]  Physical Therapy Treatment  Patient Details  Name: Cathy Tran MRN: [id number] Date of Birth: 12/19/1995 Referring Provider: Tyrell AntonioFrederic Newton, MD   Encounter Date: 09/23/2017  PT End of Session - 09/23/17 0904    Visit Number  3    Authorization Type  CCME 8 visits 10/26-12/20    Authorization - Number of Visits  8    PT Start Time  0850    PT Stop Time  0930    PT Time Calculation (min)  40 min    Activity Tolerance  Patient tolerated treatment well       Past Medical History:  Diagnosis Date  . Asthma   . Concussion July 2014    Past Surgical History:  Procedure Laterality Date  . arm surgery    . FINGER SURGERY Left    Cyst removed from left ring finger    There were no vitals filed for this visit.  Subjective Assessment - 09/23/17 0852    Subjective  I liked the needling.  The pain is less frequent and occurs less.      Currently in Pain?  No/denies    Pain Score  0-No pain    Pain Location  Neck    Pain Orientation  Left    Aggravating Factors   working for a long work shift    Pain Relieving Factors  DN         [facility] PT Assessment - 09/23/17 0001      AROM   Cervical Flexion  75    Cervical Extension  80    Cervical - Right Side Bend  70    Cervical - Left Side Bend  70    Cervical - Right Rotation  65    Cervical - Left Rotation  65                  [facility] Adult PT Treatment/Exercise - 09/23/17 0001      Shoulder Exercises: Standing   Extension  Strengthening;Both;15 reps    Row  Strengthening;Both;15 reps    Theraband Level (Shoulder Row)  Level 2 (Red)      Shoulder Exercises: ROM/Strengthening   UBE (Upper Arm Bike)  2 forward/2 backward       Manual Therapy   Soft tissue mobilization  left cervical multifidi, upper traps, rhomboids      Review of previous HEP and  discussion of progress  Trigger Point Dry Needling - 09/23/17 0927    Consent Given?  Yes    Upper Trapezius Response  Twitch reponse elicited;Palpable increased muscle length       Left only         PT Long Term Goals - 08/26/17 1104      PT LONG TERM GOAL #1   Title  Pt will be independent with final HEP for scapular and L UE strengthening    Baseline  no HEP    Time  8    Period  Weeks    Status  New    Target Date  10/21/17      PT LONG TERM GOAL #2   Title  Pt will decrease duration of Lt UE flare-ups to 4 hours or less    Baseline  flare ups last 24 hours    Time  8    Period  Weeks  Status  New    Target Date  10/21/17      PT LONG TERM GOAL #3   Title  Pt will decrease pain levels during flare-up to 3/10 or less    Baseline  8/10    Time  8    Period  Weeks    Status  New    Target Date  10/21/17      PT LONG TERM GOAL #4   Title  Pt will improve general Lt prone UT/LT/MT strength to 4/5 or greater    Baseline  4-/5    Time  8    Period  Weeks    Status  New    Target Date  10/21/17            Plan - 09/23/17 [id number]    Clinical Impression Statement  The patient reports less frequent left shoulder pain and less intense.  She is able to perform a progression of scapular strengthening exercises without the production of pain.  Excellent improvement in cervical ROM in all planes and not painful.  She would benefit from scapular strengthening for improved long term prognosis.      Rehab Potential  Good    PT Frequency  1x / week    PT Duration  8 weeks    PT Treatment/Interventions  Dry needling;Electrical Stimulation;Manual techniques;Taping;Moist Heat;Traction;Therapeutic exercise    PT Next Visit Plan  DN as needed;  review supine band ex's as needed;  add standing band rows, extensions;  prone ex; STM Lt UT/cspine,  modalities PRN       Patient will benefit from skilled therapeutic intervention in order to improve the following deficits and  impairments:  Postural dysfunction, Pain, Impaired UE functional use, Decreased strength  Visit Diagnosis: Abnormal posture  Chronic left shoulder pain  Cervicalgia  Muscle weakness (generalized)     Problem List Patient Active Problem List   Diagnosis Date Noted  . Chronic left shoulder pain 08/01/2017  . Nexplanon in place 11/12/2015  . Dysmenorrhea in adolescent 09/16/2015  . Anxiety state, unspecified 11/20/2013  . Postconcussion syndrome 09/20/2013  . Memory difficulties 09/20/2013  . Poor concentration 09/20/2013   [name], PT 09/23/17 12:09 PM Phone: [phone] Fax: [phone]  Vivien PrestoSimpson, Vinny Taranto C 09/23/2017, 12:08 PM   [facility] [address], [address] [place], [place], [address] Phone: [phone]   Fax:  [phone]  Name: Cathy Tran MRN: [id number] Date of Birth: 10/19/1996

## 2017-09-28 ENCOUNTER — Ambulatory Visit: Payer: Medicaid Other | Admitting: Physical Therapy

## 2017-09-28 DIAGNOSIS — M542 Cervicalgia: Secondary | ICD-10-CM

## 2017-09-28 DIAGNOSIS — R293 Abnormal posture: Secondary | ICD-10-CM | POA: Diagnosis not present

## 2017-09-28 DIAGNOSIS — M25512 Pain in left shoulder: Secondary | ICD-10-CM

## 2017-09-28 DIAGNOSIS — G8929 Other chronic pain: Secondary | ICD-10-CM

## 2017-09-28 DIAGNOSIS — M6281 Muscle weakness (generalized): Secondary | ICD-10-CM

## 2017-09-28 NOTE — Therapy (Signed)
[facility] [address], [address] [place], [place], [address] Phone: [phone]   Fax:  [phone]  Physical Therapy Treatment  Patient Details  Name: Cathy Tran MRN: [id number] Date of Birth: 09/27/1996 Referring Provider: Tyrell AntonioFrederic Newton, MD   Encounter Date: 09/28/2017  PT End of Session - 09/28/17 0857    Visit Number  4    Authorization Type  CCME 8 visits 10/26-12/20    Authorization - Number of Visits  8    PT Start Time  0846    PT Stop Time  0925    PT Time Calculation (min)  39 min    Activity Tolerance  Patient tolerated treatment well;No increased pain    Behavior During Therapy  WFL for tasks assessed/performed       Past Medical History:  Diagnosis Date  . Asthma   . Concussion July 2014    Past Surgical History:  Procedure Laterality Date  . arm surgery    . FINGER SURGERY Left    Cyst removed from left ring finger    There were no vitals filed for this visit.  Subjective Assessment - 09/28/17 0848    Subjective  Pt reports that things are going well. She has no pain currently. She does think that the needling has been helping alot.     Currently in Pain?  No/denies                      [facility] - 09/28/17 0001      Neck Exercises: Seated   Other Seated Exercise  low rows with green TB 2x10 reps; horizontal abduction with red TB 2x10      Shoulder Exercises: Supine   External Rotation  15 reps;Both towel squeeze under arm     Theraband Level (Shoulder External Rotation)  Level 2 (Red)    Other Supine Exercises  D2 flexion with red TB 2x10 reps      Shoulder Exercises: Prone   Other Prone Exercises  Y/T/I x10 reps each UE      Shoulder Exercises: Standing   Other Standing Exercises  plank walks Lt/Rt against wall, yellow TB around wrists x4 trials each direction      Neck Exercises: Stretches   Upper Trapezius Stretch  2 reps;30 seconds    Other Neck Stretches  cross body stretch 2x20 sec each              PT Education - 09/28/17 0923    Education provided  Yes    Education Details  technique with therex     Person(s) Educated  Patient    Methods  Explanation;Verbal cues;Tactile cues    Comprehension  Returned demonstration;Verbalized understanding          PT Long Term Goals - 09/28/17 0849      PT LONG TERM GOAL #1   Title  Pt will be independent with final HEP for scapular and L UE strengthening    Baseline  no HEP    Time  8    Period  Weeks    Status  Achieved      PT LONG TERM GOAL #2   Title  Pt will decrease duration of Lt UE flare-ups to 4 hours or less    Baseline  one small flare up in the past 48 hours, didn't last long     Time  8    Period  Weeks  Status  Achieved      PT LONG TERM GOAL #3   Title  Pt will decrease pain levels during flare-up to 3/10 or less    Baseline  8/10    Time  8    Period  Weeks    Status  New      PT LONG TERM GOAL #4   Title  Pt will improve general Lt prone UT/LT/MT strength to 4/5 or greater    Baseline  4-/5    Time  8    Period  Weeks    Status  New            Plan - 09/28/17 [id number]    Clinical Impression Statement  Pt arrived today with reports of minimal shoulder/neck pain following her last session, noting only 1 mild flare-up in the past 48 hours. Session focused on therex to improve periscapular strength. Pt does tend to rush through exercises and required therapist cuing for proper technique and posture awareness. Otherwise, pt reported no increase in pain during or following today's exercises and progressions with reps/resistance. Will continue with current POC.     Rehab Potential  Good    PT Frequency  1x / week    PT Duration  8 weeks    PT Treatment/Interventions  Dry needling;Electrical Stimulation;Manual techniques;Taping;Moist Heat;Traction;Therapeutic exercise    PT Next Visit Plan  DN as needed; progression of shoulder/LT/MT  strength;  prone ex; STM Lt UT/cspine,  modalities PRN    PT Home Exercise Plan  given scapular clock, retractions, and UT stretch 10/19    Recommended Other Services  MD signed orders     Consulted and Agree with Plan of Care  Patient       Patient will benefit from skilled therapeutic intervention in order to improve the following deficits and impairments:  Postural dysfunction, Pain, Impaired UE functional use, Decreased strength  Visit Diagnosis: Abnormal posture  Chronic left shoulder pain  Cervicalgia  Muscle weakness (generalized)     Problem List Patient Active Problem List   Diagnosis Date Noted  . Chronic left shoulder pain 08/01/2017  . Nexplanon in place 11/12/2015  . Dysmenorrhea in adolescent 09/16/2015  . Anxiety state, unspecified 11/20/2013  . Postconcussion syndrome 09/20/2013  . Memory difficulties 09/20/2013  . Poor concentration 09/20/2013   9:28 AM,09/28/17 Marylyn IshiharaSara Kiser PT, DPT [facility]  [phone]  [facility] [address] [place], [place], [address] Phone: [phone]   Fax:  [phone]  Name: Cathy Tran MRN: [id number] Date of Birth: 04/16/1996

## 2017-10-07 ENCOUNTER — Telehealth: Payer: Self-pay | Admitting: Physical Therapy

## 2017-10-07 ENCOUNTER — Ambulatory Visit: Payer: Medicaid Other | Admitting: Physical Therapy

## 2017-10-07 NOTE — Telephone Encounter (Signed)
Spoke with patient.  She got her days confused and therefore missed her appt.  Reminded of next week's appt

## 2017-10-14 ENCOUNTER — Ambulatory Visit: Payer: Medicaid Other | Attending: Physical Medicine and Rehabilitation | Admitting: Physical Therapy

## 2017-10-14 DIAGNOSIS — R293 Abnormal posture: Secondary | ICD-10-CM | POA: Diagnosis not present

## 2017-10-14 DIAGNOSIS — G8929 Other chronic pain: Secondary | ICD-10-CM | POA: Diagnosis present

## 2017-10-14 DIAGNOSIS — M542 Cervicalgia: Secondary | ICD-10-CM | POA: Diagnosis present

## 2017-10-14 DIAGNOSIS — M6281 Muscle weakness (generalized): Secondary | ICD-10-CM

## 2017-10-14 DIAGNOSIS — M25512 Pain in left shoulder: Secondary | ICD-10-CM | POA: Diagnosis present

## 2017-10-14 NOTE — Therapy (Addendum)
[facility] [address] [place], [place], [address] Phone: [phone]   Fax:  [phone]  Physical Therapy Treatment/Discharge Summary  Patient Details  Name: Cathy Tran MRN: [id number] Date of Birth: 1996-02-16 Referring Provider: Magnus Sinning, MD   Encounter Date: 10/14/2017  PT End of Session - 10/14/17 0918    Visit Number  5    Number of Visits  8    Authorization Type  CCME 8 visits 10/26-12/20    Authorization - Number of Visits  8    PT Start Time  0850    PT Stop Time  0928    PT Time Calculation (min)  38 min    Activity Tolerance  Patient tolerated treatment well       Past Medical History:  Diagnosis Date  . Asthma   . Concussion July 2014    Past Surgical History:  Procedure Laterality Date  . arm surgery    . FINGER SURGERY Left    Cyst removed from left ring finger    There were no vitals filed for this visit.  Subjective Assessment - 10/14/17 0852    Subjective  I'm feeling pretty good.  No shooting pain.  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      Currently in Pain?  No/denies    Pain Score  0-No pain    Aggravating Factors   working a long shift                      [place] Adult PT Treatment/Exercise - 10/14/17 0001      Shoulder Exercises: Prone   Other Prone Exercises  Y/T/I x10 reps each UE over green ball       Shoulder Exercises: Standing   Other Standing Exercises  red band drivers 1 min; red band wall walk 1 min    Other Standing Exercises  3# 5 reps flexion, scaption, abduction      Shoulder Exercises: ROM/Strengthening   UBE (Upper Arm Bike)  3 forward/3 backward      Shoulder Exercises: Power Warehouse manager Limitations  seated 15# vertical row 20x    Other Power Teaching laboratory technician lat bar 20# 20x      Manual Therapy   Soft tissue mobilization  Graston instrument assist fanning and sweeping upper trapezius muscle                   PT Long Term Goals - 09/28/17 0849      PT LONG TERM GOAL #1   Title  Pt will be independent with final HEP for scapular and L UE strengthening    Baseline  no HEP    Time  8    Period  Weeks    Status  Achieved      PT LONG TERM GOAL #2   Title  Pt will decrease duration of Lt UE flare-ups to 4 hours or less    Baseline  one small flare up in the past 48 hours, didn't last long     Time  8    Period  Weeks    Status  Achieved      PT LONG TERM GOAL #3   Title  Pt will decrease pain levels during flare-up to 3/10 or less    Baseline  8/10    Time  8    Period  Weeks    Status  New      PT LONG TERM GOAL #4   Title  Pt will improve general Lt prone UT/LT/MT strength to 4/5 or greater    Baseline  4-/5    Time  8    Period  Weeks    Status  New            Plan - 10/14/17 2924    Clinical Impression Statement  The patient reports she generally has no pain but with working a long shift she will get mild upper trap region pain.  She is able to participate in postural strengthening exercises with cues for postural alignment and avoid upper trap overuse.   On track to meet all rehab goals.      Rehab Potential  Good    PT Frequency  1x / week    PT Duration  8 weeks    PT Treatment/Interventions  Dry needling;Electrical Stimulation;Manual techniques;Taping;Moist Heat;Traction;Therapeutic exercise    PT Next Visit Plan  reassess progress toward goals;  may be ready for discharge next visit;  FOTO;  recheck cervical ROM;  review HEP as needed       Patient will benefit from skilled therapeutic intervention in order to improve the following deficits and impairments:  Postural dysfunction, Pain, Impaired UE functional use, Decreased strength  Visit Diagnosis: Abnormal posture  Chronic left shoulder pain  Cervicalgia  Muscle weakness (generalized)  PHYSICAL THERAPY DISCHARGE SUMMARY  Visits from Start of Care: 5  Current functional level  related to goals / functional outcomes: The patient made excellent progress with PT with treatment including manual therapy, therapeutic exercise and dry needling.  Discharge was anticipated for the next appointment but her Medicaid authorization expired.    Since discharge was planned, it was decided with the patient to discharge from PT at this time.     Remaining deficits: As above   Education / Equipment: HEP Plan: Patient agrees to discharge.  Patient goals were partially met. Patient is being discharged due to financial reasons.  ?????          Problem List Patient Active Problem List   Diagnosis Date Noted  . Chronic left shoulder pain 08/01/2017  . Nexplanon in place 11/12/2015  . Dysmenorrhea in adolescent 09/16/2015  . Anxiety state, unspecified 11/20/2013  . Postconcussion syndrome 09/20/2013  . Memory difficulties 09/20/2013  . Poor concentration 09/20/2013   Ruben Im, PT 10/14/17 11:12 AM Phone: [phone] Fax: [phone]  Alvera Singh 10/14/2017, 11:12 AM  [facility] [address], [place], [place], [address] Phone: [phone]   Fax:  [phone]  Name: Tashica Provencio MRN: [id number] Date of Birth: Nov 20, 1995

## 2017-10-21 ENCOUNTER — Ambulatory Visit: Payer: Medicaid Other | Admitting: Physical Therapy

## 2017-10-28 ENCOUNTER — Encounter: Payer: Medicaid Other | Admitting: Physical Therapy

## 2018-06-21 ENCOUNTER — Ambulatory Visit (INDEPENDENT_AMBULATORY_CARE_PROVIDER_SITE_OTHER): Payer: 59 | Admitting: Obstetrics

## 2018-06-21 ENCOUNTER — Encounter: Payer: Self-pay | Admitting: Obstetrics

## 2018-06-21 ENCOUNTER — Other Ambulatory Visit (HOSPITAL_COMMUNITY)
Admission: RE | Admit: 2018-06-21 | Discharge: 2018-06-21 | Disposition: A | Discharge status: Home / Self Care | Payer: 59 | Source: Ambulatory Visit | Attending: Obstetrics | Admitting: Obstetrics

## 2018-06-21 VITALS — BP 134/90 | HR 90 | Wt 126.5 lb

## 2018-06-21 DIAGNOSIS — Z3046 Encounter for surveillance of implantable subdermal contraceptive: Secondary | ICD-10-CM

## 2018-06-21 DIAGNOSIS — N898 Other specified noninflammatory disorders of vagina: Secondary | ICD-10-CM | POA: Diagnosis not present

## 2018-06-21 DIAGNOSIS — Z113 Encounter for screening for infections with a predominantly sexual mode of transmission: Secondary | ICD-10-CM | POA: Diagnosis not present

## 2018-06-21 DIAGNOSIS — Z01419 Encounter for gynecological examination (general) (routine) without abnormal findings: Secondary | ICD-10-CM

## 2018-06-21 DIAGNOSIS — Z0000 Encounter for general adult medical examination without abnormal findings: Secondary | ICD-10-CM | POA: Diagnosis not present

## 2018-06-21 NOTE — Progress Notes (Signed)
Pt presents for annual, pap, and all STD testing.   

## 2018-06-21 NOTE — Progress Notes (Signed)
Subjective:        Cathy Tran is a 22 y.o. female here for a routine exam.  Current complaints: None.    Personal health questionnaire:  Is patient Ashkenazi Jewish, have a family history of breast and/or ovarian cancer: no Is there a family history of uterine cancer diagnosed at age < 7450, gastrointestinal cancer, urinary tract cancer, family member who is a Personnel officerLynch syndrome-associated carrier: no Is the patient overweight and hypertensive, family history of diabetes, personal history of gestational diabetes, preeclampsia or PCOS: no Is patient over 2055, have PCOS,  family history of premature CHD under age 20, diabetes, smoke, have hypertension or peripheral artery disease:  no At any time, has a partner hit, kicked or otherwise hurt or frightened you?: no Over the past 2 weeks, have you felt down, depressed or hopeless?: no Over the past 2 weeks, have you felt little interest or pleasure in doing things?:no   Gynecologic History No LMP recorded. Patient has had an implant. Contraception: Nexplanon Last Pap: n/a. Results were: n/a Last mammogram: n/a. Results were: n/a  Obstetric History OB History  Gravida Para Term Preterm AB Living  0 0 0 0 0 0  SAB TAB Ectopic Multiple Live Births  0 0 0 0      Past Medical History:  Diagnosis Date  . Asthma   . Concussion July 2014    Past Surgical History:  Procedure Laterality Date  . arm surgery    . FINGER SURGERY Left    Cyst removed from left ring finger     Current Outpatient Medications:  .  albuterol (PROVENTIL HFA;VENTOLIN HFA) 108 (90 BASE) MCG/ACT inhaler, Inhale 2 puffs into the lungs every 6 (six) hours as needed for wheezing or shortness of breath., Disp: , Rfl:  .  ibuprofen (ADVIL,MOTRIN) 600 MG tablet, Take 1 tablet (600 mg total) by mouth 3 (three) times daily. While on your  period, Disp: 90 tablet, Rfl: 5 .  IRON PO, Take by mouth., Disp: , Rfl:  .  Multiple Vitamins-Minerals (MULTIVITAMIN PO), Take 1  tablet by mouth daily., Disp: , Rfl:  .  Acetaminophen-Pamabrom (MIDOL TEEN) 500-25 MG TABS, Take 2 tablets by mouth every 6 (six) hours as needed. (Patient not taking: Reported on 06/21/2018), Disp: 90 each, Rfl: 6 .  lidocaine (LIDODERM) 5 %, Place 1 patch onto the skin daily. Remove & Discard patch within 12 hours or as directed by MD (Patient not taking: Reported on 08/26/2017), Disp: 30 patch, Rfl: 0 .  methocarbamol (ROBAXIN) 500 MG tablet, Take 1 tablet (500 mg total) by mouth 2 (two) times daily. (Patient not taking: Reported on 08/26/2017), Disp: 20 tablet, Rfl: 0 .  tiZANidine (ZANAFLEX) 4 MG capsule, Take 1 capsule (4 mg total) by mouth at bedtime. (Patient not taking: Reported on 06/21/2018), Disp: 30 capsule, Rfl: 0 Allergies  Allergen Reactions  . Codeine Anaphylaxis and Nausea And Vomiting  . Other Other (See Comments)    Pet Dander, Dust Mites    Social History   Tobacco Use  . Smoking status: Never Smoker  . Smokeless tobacco: Never Used  Substance Use Topics  . Alcohol use: No    Alcohol/week: 0.0 standard drinks    Family History  Adopted: Yes  Problem Relation Age of Onset  . Drug abuse Mother   . ADD / ADHD Father   . Drug abuse Father   . Depression Father   . Heart attack Paternal Grandfather  Review of Systems  Constitutional: negative for fatigue and weight loss Respiratory: negative for cough and wheezing Cardiovascular: negative for chest pain, fatigue and palpitations Gastrointestinal: negative for abdominal pain and change in bowel habits Musculoskeletal:negative for myalgias Neurological: negative for gait problems and tremors Behavioral/Psych: negative for abusive relationship, depression Endocrine: negative for temperature intolerance    Genitourinary:negative for abnormal menstrual periods, genital lesions, hot flashes, sexual problems and vaginal discharge Integument/breast: negative for breast lump, breast tenderness, nipple discharge  and skin lesion(s)    Objective:       BP 134/90   Pulse 90   Wt 126 lb 8 oz (57.4 kg)   BMI 23.90 kg/m  General:   alert  Skin:   no rash or abnormalities  Lungs:   clear to auscultation bilaterally  Heart:   regular rate and rhythm, S1, S2 normal, no murmur, click, rub or gallop  Breasts:   normal without suspicious masses, skin or nipple changes or axillary nodes  Abdomen:  normal findings: no organomegaly, soft, non-tender and no hernia  Pelvis:  External genitalia: normal general appearance Urinary system: urethral meatus normal and bladder without fullness, nontender Vaginal: normal without tenderness, induration or masses Cervix: normal appearance Adnexa: normal bimanual exam Uterus: anteverted and non-tender, normal size   Lab Review Urine pregnancy test Labs reviewed yes Radiologic studies reviewed no  50% of 20 min visit spent on counseling and coordination of care.   Assessment:   1. Encounter for gynecological examination  2. Vaginal discharge Rx: - Cervicovaginal ancillary only  3. Screening for STD (sexually transmitted disease) Rx: - Hepatitis B surface antigen - Hepatitis C antibody - HIV antibody - RPR  4. Encounter for surveillance of implantable subdermal contraceptive - pleased with Nexplanon    Plan:    Education reviewed: calcium supplements, depression evaluation, low fat, low cholesterol diet, safe sex/STD prevention, self breast exams and weight bearing exercise. Contraception: Nexplanon. Follow up in: 3 months.   No orders of the defined types were placed in this encounter.  Orders Placed This Encounter  Procedures  . Hepatitis B surface antigen  . Hepatitis C antibody  . HIV antibody  . RPR    Brock BadHARLES A. Navah Grondin MD 06-21-2018

## 2018-06-22 LAB — CERVICOVAGINAL ANCILLARY ONLY
BACTERIAL VAGINITIS: NEGATIVE
CANDIDA VAGINITIS: NEGATIVE
CHLAMYDIA, DNA PROBE: NEGATIVE
NEISSERIA GONORRHEA: NEGATIVE
Trichomonas: NEGATIVE

## 2018-06-23 LAB — HIV ANTIBODY (ROUTINE TESTING W REFLEX): HIV SCREEN 4TH GENERATION: NONREACTIVE

## 2018-06-23 LAB — HEPATITIS B SURFACE ANTIGEN: Hepatitis B Surface Ag: NEGATIVE

## 2018-06-23 LAB — RPR: RPR: NONREACTIVE

## 2018-06-23 LAB — HEPATITIS C ANTIBODY: Hep C Virus Ab: <0.1 s/co ratio (ref 0.0–0.9)

## 2018-07-01 DIAGNOSIS — R509 Fever, unspecified: Secondary | ICD-10-CM | POA: Diagnosis not present

## 2018-07-01 DIAGNOSIS — J029 Acute pharyngitis, unspecified: Secondary | ICD-10-CM | POA: Diagnosis not present

## 2018-07-02 IMAGING — CT CT HEAD W/O CM
4 series · 16 of 47 positions shown, 18 images · non-contrast
Comparison: October 31, 2013

CLINICAL DATA: Left neck pain radiating into left arm for 6 weeks.

EXAM:
CT HEAD WITHOUT CONTRAST
TECHNIQUE: Contiguous axial images were obtained from the base of the skull
through the vertex without intravenous contrast.

[Series 3: head wo · axial · 0.43mm/px · z∈[+1576,+1696]mm · 7 of 33 slices shown, 9 images]
[im 5/33  brain]
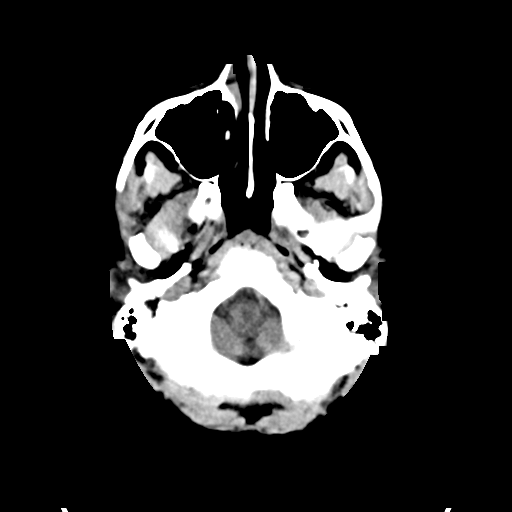
[im 5/33  bone]
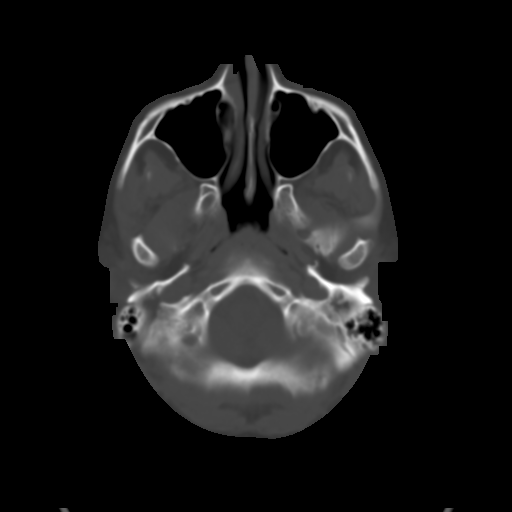
[im 9/33  brain]
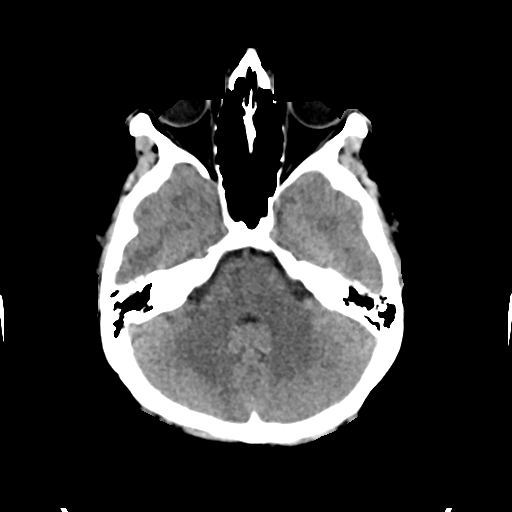
[im 13/33  brain]
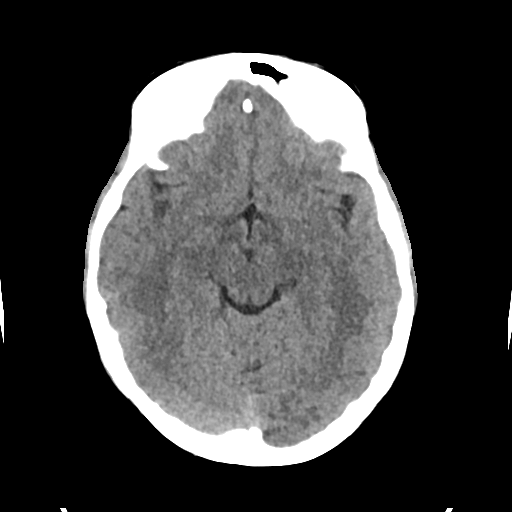
[im 17/33  brain]
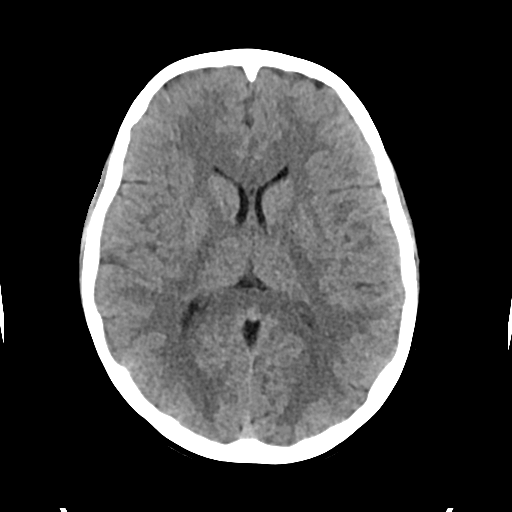
[im 21/33  brain]
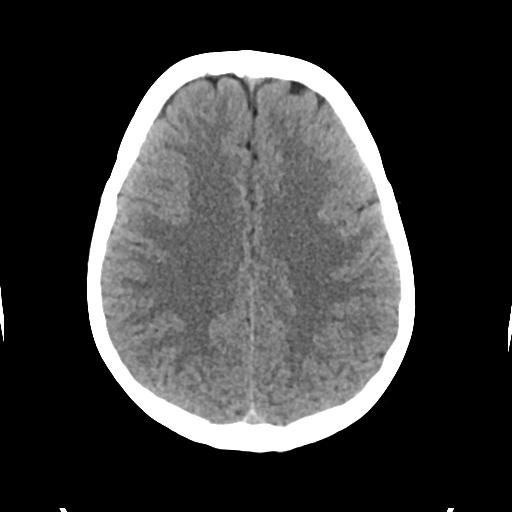
[im 21/33  bone]
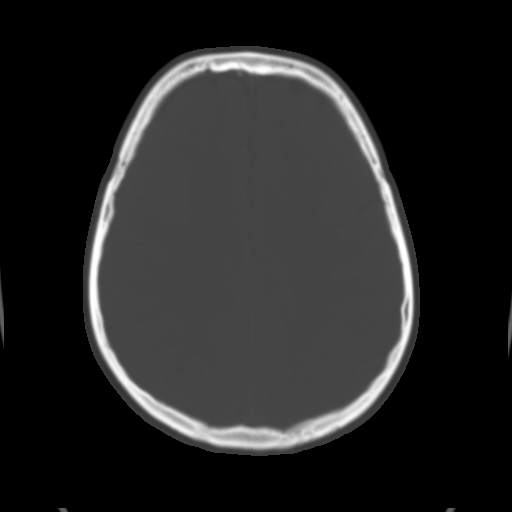
[im 25/33  brain]
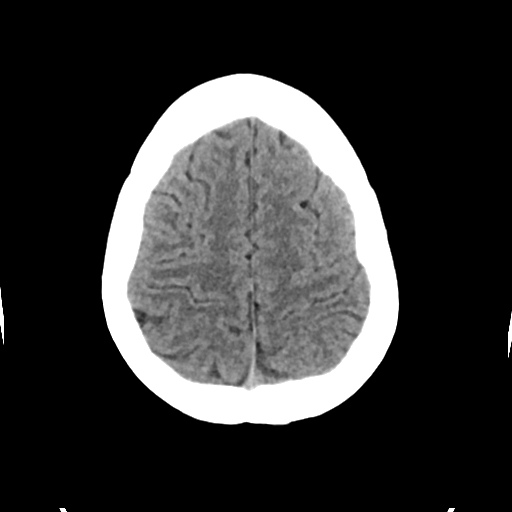
[im 29/33  brain]
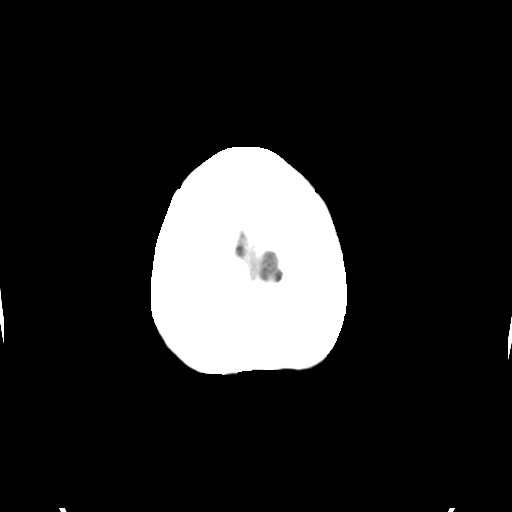

[Series 4: head bone · axial · 0.43mm/px · z∈[+1572,+1604]mm · 3 of 82 slices shown]
[im 9/82  bone]
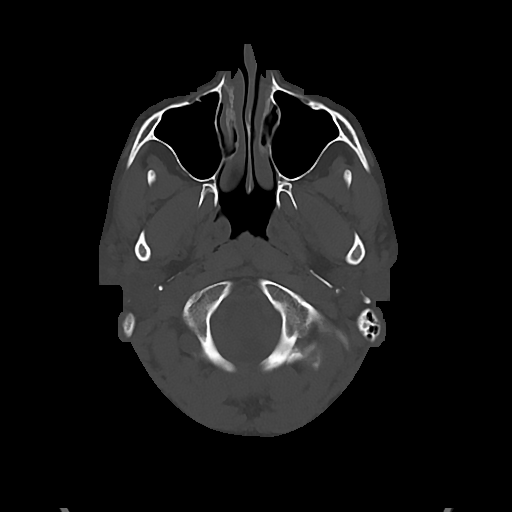
[im 17/82  bone]
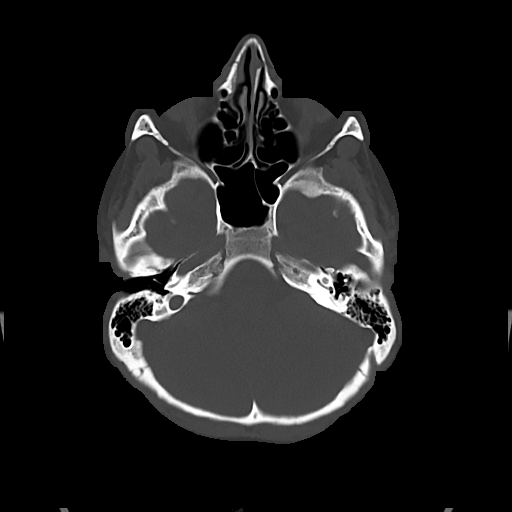
[im 25/82  bone]
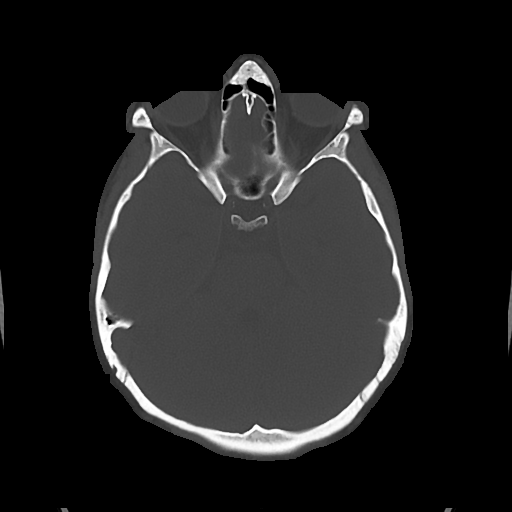

[Series 5: cor soft · coronal · 0.30mm/px · 3 of 67 slices shown]
[im 23/67  brain]
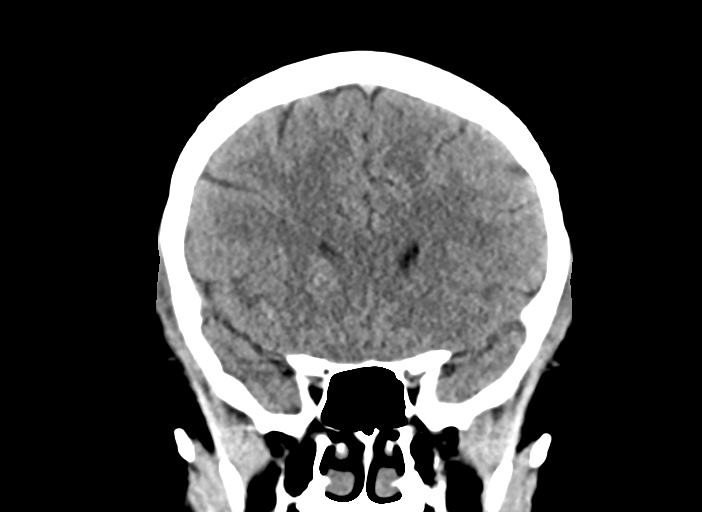
[im 30/67  brain]
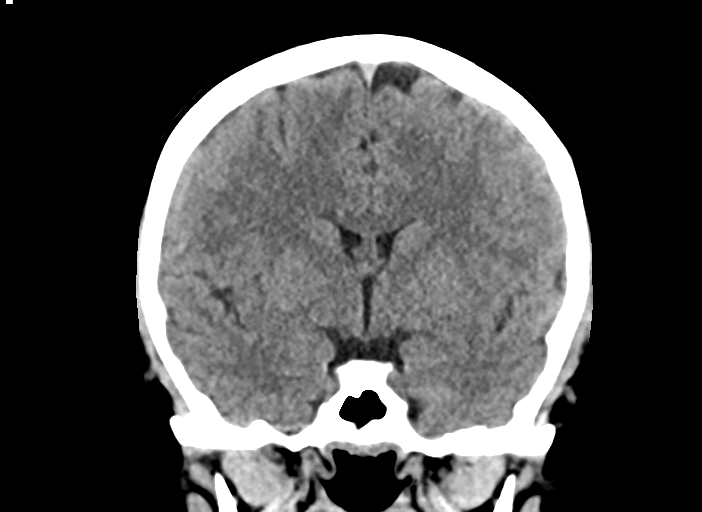
[im 37/67  brain]
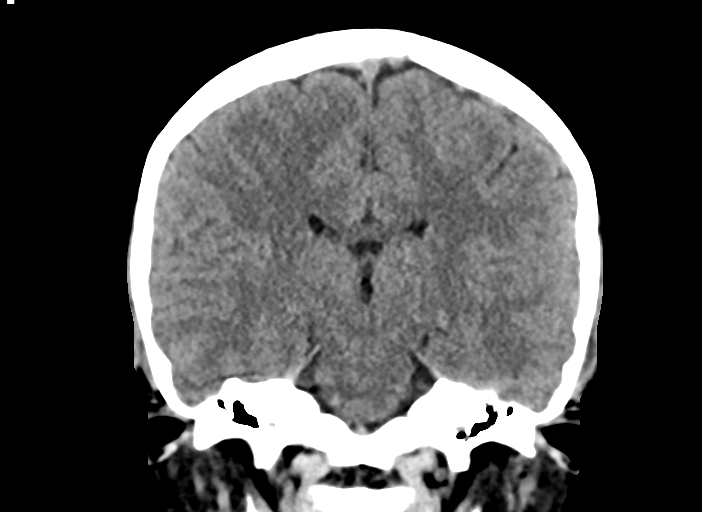

[Series 6: sag soft · sagittal · 0.36mm/px · 3 of 67 slices shown]
[im 23/67  brain]
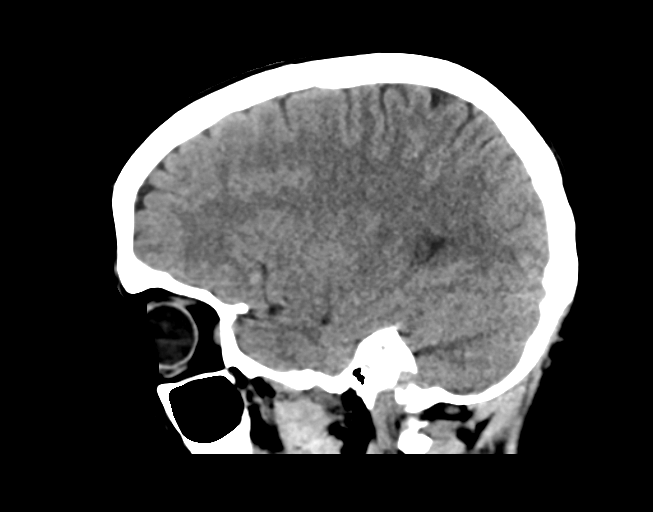
[im 34/67  brain]
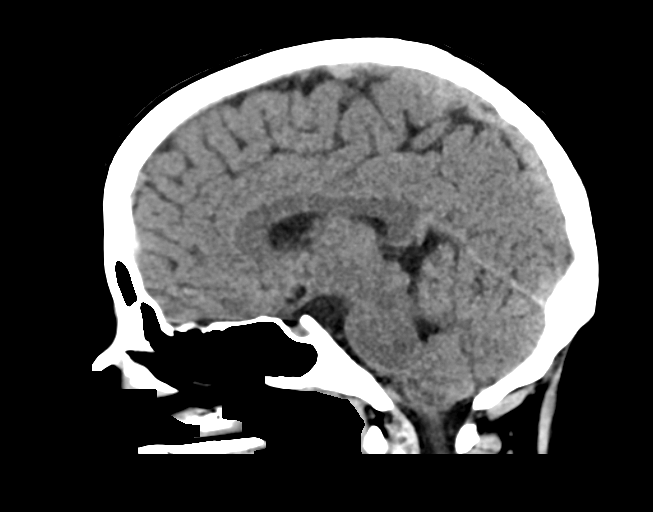
[im 45/67  brain]
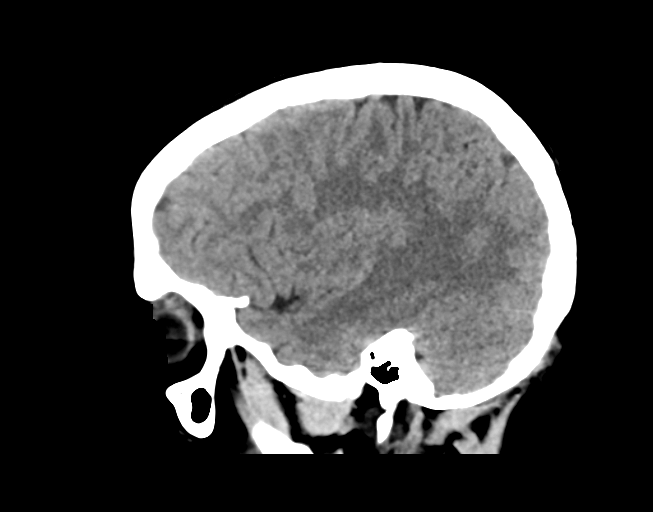

[16 of 47 positions shown; findings below may reference images not displayed]

FINDINGS: Brain: No evidence of acute infarction, hemorrhage, hydrocephalus,
extra-axial collection or mass lesion/mass effect.

Vascular: No hyperdense vessel or unexpected calcification.

Skull: Normal. Negative for fracture or focal lesion.

Sinuses/Orbits: The paranasal sinuses, middle ears, and right
mastoid air cells are within normal limits. Opacification of
inferior medial left mastoid air cells is unchanged since 7510, of
no acute significance.

Other: None.
IMPRESSION: No acute abnormality.  Essentially normal study.

## 2018-09-09 DIAGNOSIS — J029 Acute pharyngitis, unspecified: Secondary | ICD-10-CM | POA: Diagnosis not present

## 2018-10-06 ENCOUNTER — Encounter

## 2018-10-27 DIAGNOSIS — Z6822 Body mass index (BMI) 22.0-22.9, adult: Secondary | ICD-10-CM | POA: Diagnosis not present

## 2018-10-27 DIAGNOSIS — R452 Unhappiness: Secondary | ICD-10-CM | POA: Diagnosis not present

## 2018-10-27 DIAGNOSIS — Z113 Encounter for screening for infections with a predominantly sexual mode of transmission: Secondary | ICD-10-CM | POA: Diagnosis not present

## 2018-10-27 DIAGNOSIS — Z1331 Encounter for screening for depression: Secondary | ICD-10-CM | POA: Diagnosis not present

## 2018-10-27 DIAGNOSIS — Z0000 Encounter for general adult medical examination without abnormal findings: Secondary | ICD-10-CM | POA: Diagnosis not present

## 2018-10-27 DIAGNOSIS — Z713 Dietary counseling and surveillance: Secondary | ICD-10-CM | POA: Diagnosis not present

## 2018-10-27 DIAGNOSIS — Z0001 Encounter for general adult medical examination with abnormal findings: Secondary | ICD-10-CM | POA: Diagnosis not present

## 2018-10-27 DIAGNOSIS — R4582 Worries: Secondary | ICD-10-CM | POA: Diagnosis not present

## 2018-10-27 DIAGNOSIS — F419 Anxiety disorder, unspecified: Secondary | ICD-10-CM | POA: Diagnosis not present

## 2018-10-29 DIAGNOSIS — F419 Anxiety disorder, unspecified: Secondary | ICD-10-CM | POA: Diagnosis not present

## 2018-10-29 DIAGNOSIS — Z0000 Encounter for general adult medical examination without abnormal findings: Secondary | ICD-10-CM | POA: Diagnosis not present

## 2018-10-29 DIAGNOSIS — Z1331 Encounter for screening for depression: Secondary | ICD-10-CM | POA: Diagnosis not present

## 2018-10-29 DIAGNOSIS — Z713 Dietary counseling and surveillance: Secondary | ICD-10-CM | POA: Diagnosis not present

## 2018-10-29 DIAGNOSIS — Z113 Encounter for screening for infections with a predominantly sexual mode of transmission: Secondary | ICD-10-CM | POA: Diagnosis not present

## 2018-10-29 DIAGNOSIS — R452 Unhappiness: Secondary | ICD-10-CM | POA: Diagnosis not present

## 2018-10-29 DIAGNOSIS — R4582 Worries: Secondary | ICD-10-CM | POA: Diagnosis not present

## 2018-10-29 DIAGNOSIS — Z0001 Encounter for general adult medical examination with abnormal findings: Secondary | ICD-10-CM | POA: Diagnosis not present

## 2018-10-29 DIAGNOSIS — Z6822 Body mass index (BMI) 22.0-22.9, adult: Secondary | ICD-10-CM | POA: Diagnosis not present

## 2018-11-10 DIAGNOSIS — R450 Nervousness: Secondary | ICD-10-CM | POA: Diagnosis not present

## 2018-11-10 DIAGNOSIS — F419 Anxiety disorder, unspecified: Secondary | ICD-10-CM | POA: Diagnosis not present

## 2018-12-07 ENCOUNTER — Encounter: Payer: Self-pay | Admitting: Certified Nurse Midwife

## 2018-12-07 ENCOUNTER — Ambulatory Visit (INDEPENDENT_AMBULATORY_CARE_PROVIDER_SITE_OTHER): Payer: Medicaid Other | Admitting: Certified Nurse Midwife

## 2018-12-07 VITALS — BP 130/84 | HR 77 | Ht 62.0 in | Wt 122.3 lb

## 2018-12-07 DIAGNOSIS — Z30017 Encounter for initial prescription of implantable subdermal contraceptive: Secondary | ICD-10-CM

## 2018-12-07 DIAGNOSIS — Z3046 Encounter for surveillance of implantable subdermal contraceptive: Secondary | ICD-10-CM

## 2018-12-07 MED ORDER — ETONOGESTREL 68 MG ~~LOC~~ IMPL
68.0000 mg | DRUG_IMPLANT | Freq: Once | SUBCUTANEOUS | Status: AC
  Administered 2018-12-07: 68 mg via SUBCUTANEOUS

## 2018-12-07 NOTE — Progress Notes (Signed)
Presents for the removal and insert of Nexplanon.  Nexplanon should have been removed 09/2018.  UPT today is NEGATIVE

## 2018-12-07 NOTE — Progress Notes (Signed)
GYNECOLOGY CLINIC PROCEDURE NOTE  Ms. Cathy Tran is a 23 y.o. G0P0000 here for Nexplanon removal and reinsertion of Nexplanon. No GYN concerns.  No other gynecologic concerns. UPT negative today   Nexplanon Removal and Insertion  Patient was given informed consent for removal of her Implanon and insertion of Nexplanon.  Patient does understand that irregular bleeding is a very common side effect of this medication. She was advised to have backup contraception for one week after replacement of the implant. Pregnancy test in clinic today was negative.  Appropriate time out taken. Implanon site identified. Area prepped in usual sterile fashon. One ml of 1% lidocaine was used to anesthetize the area at the distal end of the implant. A small stab incision was made right beside the implant on the distal portion. The Nexplanon rod was grasped using hemostats and removed without difficulty. There was minimal blood loss. There were no complications. Area was then injected with 3 ml of 1 % lidocaine. She was re-prepped with betadine, Nexplanon removed from packaging, Device confirmed in needle, then inserted full length of needle and withdrawn per handbook instructions. Nexplanon was able to palpated in the patient's arm; patient palpated the insert herself.  There was minimal blood loss. Patient insertion site covered with guaze and a pressure bandage to reduce any bruising. The patient tolerated the procedure well and was given post procedure instructions.   Sharyon Cable, CNM 12/07/2018 3:39 PM

## 2018-12-07 NOTE — Patient Instructions (Signed)
Nexplanon Instructions After Insertion  Keep bandage clean and dry for 24 hours  May use ice/Tylenol/Ibuprofen for soreness or pain  If you develop fever, drainage or increased warmth from incision site-contact office immediately   

## 2020-02-05 ENCOUNTER — Other Ambulatory Visit: Payer: Self-pay

## 2020-02-05 ENCOUNTER — Other Ambulatory Visit (HOSPITAL_COMMUNITY)
Admission: RE | Admit: 2020-02-05 | Discharge: 2020-02-05 | Disposition: A | Discharge status: Home / Self Care | Payer: 59 | Source: Ambulatory Visit | Attending: Advanced Practice Midwife | Admitting: Advanced Practice Midwife

## 2020-02-05 ENCOUNTER — Ambulatory Visit (INDEPENDENT_AMBULATORY_CARE_PROVIDER_SITE_OTHER): Payer: 59 | Admitting: Advanced Practice Midwife

## 2020-02-05 ENCOUNTER — Encounter: Payer: Self-pay | Admitting: Advanced Practice Midwife

## 2020-02-05 VITALS — BP 141/91 | HR 98 | Ht 62.0 in | Wt 118.6 lb

## 2020-02-05 DIAGNOSIS — Z30430 Encounter for insertion of intrauterine contraceptive device: Secondary | ICD-10-CM | POA: Diagnosis not present

## 2020-02-05 DIAGNOSIS — Z113 Encounter for screening for infections with a predominantly sexual mode of transmission: Secondary | ICD-10-CM

## 2020-02-05 DIAGNOSIS — N921 Excessive and frequent menstruation with irregular cycle: Secondary | ICD-10-CM

## 2020-02-05 DIAGNOSIS — Z975 Presence of (intrauterine) contraceptive device: Secondary | ICD-10-CM

## 2020-02-05 DIAGNOSIS — Z3202 Encounter for pregnancy test, result negative: Secondary | ICD-10-CM | POA: Diagnosis not present

## 2020-02-05 DIAGNOSIS — Z3009 Encounter for other general counseling and advice on contraception: Secondary | ICD-10-CM

## 2020-02-05 DIAGNOSIS — Z3046 Encounter for surveillance of implantable subdermal contraceptive: Secondary | ICD-10-CM

## 2020-02-05 DIAGNOSIS — Z01419 Encounter for gynecological examination (general) (routine) without abnormal findings: Secondary | ICD-10-CM

## 2020-02-05 LAB — POCT URINE PREGNANCY: Preg Test, Ur: NEGATIVE

## 2020-02-05 MED ORDER — LEVONORGESTREL 19.5 MCG/DAY IU IUD: INTRAUTERINE_SYSTEM | Freq: Once | INTRAUTERINE | Status: AC

## 2020-02-05 NOTE — Progress Notes (Signed)
Pt presents for annual requests STD testing.  Pt wants Nexplanon removed d/t heavy & irrregular cycles since Nov.  Nexplanon inserted 12/07/2018 Planning for IUD in the future.  Gardasil UTD per pt  GAD = 6

## 2020-02-05 NOTE — Progress Notes (Signed)
Subjective:     Cathy Tran is a 24 y.o. female here at [facility] for a routine exam.  Current complaints: irregular bleeding, frequent bleeding with Nexplanon. She desires Nexplanon out today.  Personal health questionnaire reviewed: yes.  Do you have a primary care provider? yes    Gynecologic History Patient's last menstrual period was 01/26/2020. Contraception: Nexplanon Last Pap: none.   Obstetric History OB History  Gravida Para Term Preterm AB Living  0 0 0 0 0 0  SAB TAB Ectopic Multiple Live Births  0 0 0 0       The following portions of the patient's history were reviewed and updated as appropriate: allergies, current medications, past family history, past medical history, past social history, past surgical history and problem list.  Review of Systems Pertinent items noted in HPI and remainder of comprehensive ROS otherwise negative.    Objective:  BP (!) 141/91   Pulse 98   Ht 5\' 2"  (1.575 m)   Wt 118 lb 9.6 oz (53.8 kg)   LMP 01/26/2020   BMI 21.69 kg/m    VS reviewed, nursing note reviewed,  Constitutional: well developed, well nourished, no distress HEENT: normocephalic CV: normal rate Pulm/chest wall: normal effort Breast Exam:  Deferred due to current guidelines with shared decision making Abdomen: soft Neuro: alert and oriented x 3 Skin: warm, dry Psych: affect normal Pelvic exam: Cervix pink, visually closed, without lesion, scant white creamy discharge, vaginal walls and external genitalia normal Bimanual exam: Cervix 0/long/high, firm, anterior, neg CMT, uterus nontender, nonenlarged, adnexa without tenderness, enlargement, or mass   Nexplanon Removal Patient identified, informed consent performed, consent signed.   Appropriate time out taken. Nexplanon site identified.  Area prepped in usual sterile fashon. One ml of 1% lidocaine was used to anesthetize the area at the distal end of the implant. A small stab incision was made right beside the  implant on the distal portion.  The Nexplanon rod was grasped using hemostats and removed without difficulty.  There was minimal blood loss. There were no complications.  3 ml of 1% lidocaine was injected around the incision for post-procedure analgesia.  Steri-strips were applied over the small incision.  A pressure bandage was applied to reduce any bruising.  The patient tolerated the procedure well and was given post procedure instructions.  Patient is planning to use IUD for contraception.    IUD Procedure Note Patient identified, informed consent performed.  Discussed risks of irregular bleeding, cramping, infection, malpositioning or misplacement of the IUD outside the uterus which may require further procedures. Time out was performed.  Urine pregnancy test negative.  Speculum placed in the vagina.  Cervix visualized.  Cleaned with Betadine x 2.  Grasped anteriorly with a single tooth tenaculum.  Uterus sounded to 6 cm.  Liletta IUD placed per manufacturer's recommendations.  Strings trimmed to 3 cm. Tenaculum was removed, good hemostasis noted.  Patient tolerated procedure well.   Patient was given post-procedure instructions and the Liletta care card with expiration date.  Patient was also asked to check IUD strings periodically and follow up in 4-6 weeks for IUD check.     Assessment/Plan:   1. Screen for STD (sexually transmitted disease)  - Hepatitis B surface antigen - Hepatitis C antibody - HIV Antibody (routine testing w rflx) - RPR - Cervicovaginal ancillary only( [place])  2. Well woman exam with routine gynecological exam --No gyn concerns or complaints other than breakthrough bleeding with Nexplanon - Cytology -  PAP  3. Encounter for IUD insertion --IUD inserted without difficulty, pt tolerated well. See above procedure note. - POCT urine pregnancy  4. Encounter for counseling regarding contraception --Pt desires IUD, initially planned to wait 2-3 months but will  need Pap today or in 3 months with IUD so opted to have Nexplanon removed , Pap collected, and IUD placed at today's visit.  Pt desires Liletta IUD.  See above procedure note.  5. Encounter for Nexplanon removal    6. Breakthrough bleeding on Nexplanon     Follow up in: 4 weeks for string check or as needed.   Sharen Counter, CNM 3:27 PM

## 2020-02-06 LAB — HEPATITIS B SURFACE ANTIGEN: Hepatitis B Surface Ag: NEGATIVE

## 2020-02-06 LAB — HIV ANTIBODY (ROUTINE TESTING W REFLEX): HIV Screen 4th Generation wRfx: NONREACTIVE

## 2020-02-06 LAB — RPR: RPR Ser Ql: NONREACTIVE

## 2020-02-06 LAB — HEPATITIS C ANTIBODY: Hep C Virus Ab: <0.1 s/co ratio (ref 0.0–0.9)

## 2020-02-07 LAB — CERVICOVAGINAL ANCILLARY ONLY
Chlamydia: NEGATIVE
Comment: NEGATIVE
Comment: NEGATIVE
Comment: NORMAL
Neisseria Gonorrhea: NEGATIVE
Trichomonas: NEGATIVE

## 2020-02-08 LAB — CYTOLOGY - PAP: Adequacy: ABNORMAL

## 2020-02-20 ENCOUNTER — Telehealth: Payer: Self-pay

## 2020-02-20 NOTE — Telephone Encounter (Signed)
Attempted to contact about need for repeat pap, no answer, left vm

## 2020-02-20 NOTE — Telephone Encounter (Signed)
S/w pt and advised of need to repeat pap at 4-27 appt, pt agreed.

## 2020-03-04 ENCOUNTER — Ambulatory Visit: Payer: 59 | Admitting: Advanced Practice Midwife

## 2020-03-18 ENCOUNTER — Encounter: Payer: Self-pay | Admitting: Advanced Practice Midwife

## 2020-03-18 ENCOUNTER — Other Ambulatory Visit (HOSPITAL_COMMUNITY)
Admission: RE | Admit: 2020-03-18 | Discharge: 2020-03-18 | Disposition: A | Discharge status: Home / Self Care | Payer: 59 | Source: Ambulatory Visit | Attending: Advanced Practice Midwife | Admitting: Advanced Practice Midwife

## 2020-03-18 ENCOUNTER — Other Ambulatory Visit: Payer: Self-pay

## 2020-03-18 ENCOUNTER — Ambulatory Visit (INDEPENDENT_AMBULATORY_CARE_PROVIDER_SITE_OTHER): Payer: 59 | Admitting: Advanced Practice Midwife

## 2020-03-18 VITALS — BP 133/88 | HR 76 | Wt 119.7 lb

## 2020-03-18 DIAGNOSIS — Z124 Encounter for screening for malignant neoplasm of cervix: Secondary | ICD-10-CM | POA: Diagnosis present

## 2020-03-18 DIAGNOSIS — N921 Excessive and frequent menstruation with irregular cycle: Secondary | ICD-10-CM | POA: Diagnosis not present

## 2020-03-18 DIAGNOSIS — Z30431 Encounter for routine checking of intrauterine contraceptive device: Secondary | ICD-10-CM

## 2020-03-18 DIAGNOSIS — Z1272 Encounter for screening for malignant neoplasm of vagina: Secondary | ICD-10-CM

## 2020-03-18 DIAGNOSIS — Z975 Presence of (intrauterine) contraceptive device: Secondary | ICD-10-CM

## 2020-03-18 MED ORDER — NORGESTIMATE-ETH ESTRADIOL 0.25-35 MG-MCG PO TABS: 1.0000 | ORAL_TABLET | Freq: Every day | ORAL | 3 refills | Status: AC

## 2020-03-18 NOTE — Progress Notes (Signed)
Pt is in the office for IUD string check and repeat pap.

## 2020-03-18 NOTE — Progress Notes (Signed)
   GYNECOLOGY CLINIC PROGRESS NOTE  History:  24 y.o. G0P0000 here at [facility] today for today for IUD string check; Liletta IUD was placed  02/05/20. She is having some frequent light spotting but reports it is not bothering her and she knows it should improve.  Otherwise there are no concerning side effects.  The following portions of the patient's history were reviewed and updated as appropriate: allergies, current medications, past family history, past medical history, past social history, past surgical history and problem list. Last pap smear on 02/05/20 returned as unsatisfactory due to scant cellularity.  She has no prior Pap hx.  Review of Systems:  Pertinent items are noted in HPI.   Objective:  Physical Exam Blood pressure 133/88, pulse 76, weight 119 lb 11.2 oz (54.3 kg). Gen: NAD Abd: Soft, nontender and nondistended Pelvic: Normal appearing external genitalia; normal appearing vaginal mucosa and cervix.  IUD strings visualized, about 3 cm in length outside cervix.   Assessment & Plan:  Normal IUD check.  1. Encounter for Papanicolaou smear for cervical cancer screening --Pap done at last visit with inadequate sample, not resulted. Repeat today. - Cytology - PAP( [place])  2. Breakthrough bleeding associated with intrauterine device (IUD) --Intermittent spotting x 2+ weeks, pt aware that this is normal in first 3 months of IUD.  She is happy with IUD for contraception at this time. --Bleeding has stopped x 3 days now --Rx for OCPs if another episode of bleeding > 7-10 days. - norgestimate-ethinyl estradiol (ORTHO-CYCLEN) 0.25-35 MG-MCG tablet; Take 1 tablet by mouth daily.  Dispense: 1 Package; Refill: 3  3. IUD check up Patient to keep IUD in place for six years; can come in for removal if she desires pregnancy within the next six years. Routine preventative health maintenance measures emphasized.  Sharen Counter, CNM 11:57 AM

## 2020-03-19 LAB — CYTOLOGY - PAP
Comment: NEGATIVE
Diagnosis: NEGATIVE
High risk HPV: NEGATIVE

## 2023-03-29 ENCOUNTER — Ambulatory Visit: Payer: Medicaid Other | Admitting: Orthopaedic Surgery
